# Patient Record
Sex: Male | Born: 1950 | Race: White | Hispanic: No | Marital: Married | Smoking: Current every day smoker
Health system: Southern US, Community
[De-identification: ages and names within clinical notes are randomized; demographics above are authoritative.]

## PROBLEM LIST (undated history)

## (undated) DIAGNOSIS — R05 Cough: Secondary | ICD-10-CM

## (undated) DIAGNOSIS — R2689 Other abnormalities of gait and mobility: Secondary | ICD-10-CM

## (undated) DIAGNOSIS — I10 Essential (primary) hypertension: Secondary | ICD-10-CM

## (undated) DIAGNOSIS — F1010 Alcohol abuse, uncomplicated: Secondary | ICD-10-CM

## (undated) DIAGNOSIS — R062 Wheezing: Secondary | ICD-10-CM

## (undated) DIAGNOSIS — R0989 Other specified symptoms and signs involving the circulatory and respiratory systems: Secondary | ICD-10-CM

## (undated) DIAGNOSIS — R059 Cough, unspecified: Secondary | ICD-10-CM

## (undated) HISTORY — PX: ELBOW SURGERY: SHX618

## (undated) HISTORY — PX: TONSILLECTOMY: SUR1361

---

## 1999-10-08 ENCOUNTER — Inpatient Hospital Stay (HOSPITAL_COMMUNITY): Admission: EM | Admit: 1999-10-08 | Discharge: 1999-10-11 | Payer: Self-pay | Admitting: *Deleted

## 2015-08-13 ENCOUNTER — Encounter: Payer: Self-pay | Admitting: *Deleted

## 2015-08-13 NOTE — Pre-Procedure Instructions (Signed)
GENERAL ANESTHESIA DUE TO ANXIETY

## 2015-08-20 ENCOUNTER — Encounter: Payer: Self-pay | Admitting: *Deleted

## 2015-08-20 ENCOUNTER — Ambulatory Visit
Admission: RE | Admit: 2015-08-20 | Discharge: 2015-08-20 | Disposition: A | Discharge status: Home / Self Care | Payer: BLUE CROSS/BLUE SHIELD | Source: Ambulatory Visit | Attending: Ophthalmology | Admitting: Ophthalmology

## 2015-08-20 ENCOUNTER — Encounter
Admission: RE | Disposition: A | Discharge status: Home / Self Care | Payer: Self-pay | Source: Ambulatory Visit | Attending: Ophthalmology

## 2015-08-20 ENCOUNTER — Ambulatory Visit: Payer: BLUE CROSS/BLUE SHIELD | Admitting: Registered Nurse

## 2015-08-20 DIAGNOSIS — E669 Obesity, unspecified: Secondary | ICD-10-CM | POA: Diagnosis not present

## 2015-08-20 DIAGNOSIS — R05 Cough: Secondary | ICD-10-CM | POA: Diagnosis not present

## 2015-08-20 DIAGNOSIS — J449 Chronic obstructive pulmonary disease, unspecified: Secondary | ICD-10-CM | POA: Insufficient documentation

## 2015-08-20 DIAGNOSIS — H2511 Age-related nuclear cataract, right eye: Secondary | ICD-10-CM | POA: Insufficient documentation

## 2015-08-20 DIAGNOSIS — I10 Essential (primary) hypertension: Secondary | ICD-10-CM | POA: Insufficient documentation

## 2015-08-20 DIAGNOSIS — F419 Anxiety disorder, unspecified: Secondary | ICD-10-CM | POA: Insufficient documentation

## 2015-08-20 DIAGNOSIS — E7800 Pure hypercholesterolemia, unspecified: Secondary | ICD-10-CM | POA: Insufficient documentation

## 2015-08-20 DIAGNOSIS — Z87891 Personal history of nicotine dependence: Secondary | ICD-10-CM | POA: Insufficient documentation

## 2015-08-20 DIAGNOSIS — R062 Wheezing: Secondary | ICD-10-CM | POA: Diagnosis not present

## 2015-08-20 HISTORY — DX: Cough: R05

## 2015-08-20 HISTORY — DX: Cough, unspecified: R05.9

## 2015-08-20 HISTORY — DX: Essential (primary) hypertension: I10

## 2015-08-20 HISTORY — DX: Wheezing: R06.2

## 2015-08-20 HISTORY — PX: CATARACT EXTRACTION W/PHACO: SHX586

## 2015-08-20 HISTORY — DX: Other specified symptoms and signs involving the circulatory and respiratory systems: R09.89

## 2015-08-20 SURGERY — PHACOEMULSIFICATION, CATARACT, WITH IOL INSERTION
Anesthesia: General | Site: Eye | Laterality: Right | Wound class: Clean

## 2015-08-20 MED ORDER — ONDANSETRON HCL 4 MG/2ML IJ SOLN
INTRAMUSCULAR | Status: DC | PRN
  Administered 2015-08-20: 4 mg via INTRAVENOUS

## 2015-08-20 MED ORDER — MIDAZOLAM HCL 2 MG/2ML IJ SOLN
INTRAMUSCULAR | Status: AC
  Administered 2015-08-20: 2 mg via INTRAVENOUS
  Filled 2015-08-20: qty 2

## 2015-08-20 MED ORDER — POVIDONE-IODINE 5 % OP SOLN
1.0000 "application " | Freq: Once | OPHTHALMIC | Status: AC
  Administered 2015-08-20: 1 via OPHTHALMIC

## 2015-08-20 MED ORDER — ARMC OPHTHALMIC DILATING GEL
OPHTHALMIC | Status: AC
  Administered 2015-08-20: 1 via OPHTHALMIC
  Filled 2015-08-20: qty 0.25

## 2015-08-20 MED ORDER — TETRACAINE HCL 0.5 % OP SOLN
1.0000 [drp] | Freq: Once | OPHTHALMIC | Status: AC
  Administered 2015-08-20: 1 [drp] via OPHTHALMIC

## 2015-08-20 MED ORDER — MIDAZOLAM HCL 2 MG/2ML IJ SOLN
INTRAMUSCULAR | Status: DC | PRN
  Administered 2015-08-20: 1 mg via INTRAVENOUS

## 2015-08-20 MED ORDER — SODIUM CHLORIDE 0.9 % IV SOLN
INTRAVENOUS | Status: DC
  Administered 2015-08-20: 08:00:00 via INTRAVENOUS

## 2015-08-20 MED ORDER — MOXIFLOXACIN HCL 0.5 % OP SOLN
OPHTHALMIC | Status: DC | PRN
  Administered 2015-08-20: 1 [drp] via OPHTHALMIC

## 2015-08-20 MED ORDER — ARMC OPHTHALMIC DILATING GEL
1.0000 | OPHTHALMIC | Status: DC | PRN
Start: 2015-08-20 — End: 2015-08-20
  Administered 2015-08-20: 1 via OPHTHALMIC

## 2015-08-20 MED ORDER — TRYPAN BLUE 0.06 % OP SOLN: OPHTHALMIC | Status: DC | PRN

## 2015-08-20 MED ORDER — LIDOCAINE HCL (PF) 4 % IJ SOLN
INTRAOCULAR | Status: DC | PRN
  Administered 2015-08-20: 4 mL via OPHTHALMIC

## 2015-08-20 MED ORDER — ONDANSETRON HCL 4 MG/2ML IJ SOLN: 4.0000 mg | Freq: Once | INTRAMUSCULAR | Status: DC | PRN

## 2015-08-20 MED ORDER — MIDAZOLAM HCL 2 MG/2ML IJ SOLN
2.0000 mg | Freq: Once | INTRAMUSCULAR | Status: AC
  Administered 2015-08-20: 2 mg via INTRAVENOUS

## 2015-08-20 MED ORDER — POVIDONE-IODINE 5 % OP SOLN
OPHTHALMIC | Status: AC
  Administered 2015-08-20: 1 via OPHTHALMIC
  Filled 2015-08-20: qty 30

## 2015-08-20 MED ORDER — TRYPAN BLUE 0.06 % OP SOLN
OPHTHALMIC | Status: AC
  Filled 2015-08-20: qty 0.5

## 2015-08-20 MED ORDER — MOXIFLOXACIN HCL 0.5 % OP SOLN: 1.0000 [drp] | OPHTHALMIC | Status: DC | PRN

## 2015-08-20 MED ORDER — NA HYALUR & NA CHOND-NA HYALUR 0.55-0.5 ML IO KIT
PACK | INTRAOCULAR | Status: AC
  Filled 2015-08-20: qty 1.05

## 2015-08-20 MED ORDER — CEFUROXIME OPHTHALMIC INJECTION 1 MG/0.1 ML
INJECTION | OPHTHALMIC | Status: DC | PRN
  Administered 2015-08-20: 1 mg via INTRACAMERAL

## 2015-08-20 MED ORDER — MOXIFLOXACIN HCL 0.5 % OP SOLN
OPHTHALMIC | Status: AC
  Filled 2015-08-20: qty 3

## 2015-08-20 MED ORDER — EPINEPHRINE HCL 1 MG/ML IJ SOLN
INTRAMUSCULAR | Status: AC
  Filled 2015-08-20: qty 1

## 2015-08-20 MED ORDER — FENTANYL CITRATE (PF) 100 MCG/2ML IJ SOLN
INTRAMUSCULAR | Status: DC | PRN
  Administered 2015-08-20: 50 ug via INTRAVENOUS

## 2015-08-20 MED ORDER — NA HYALUR & NA CHOND-NA HYALUR 0.55-0.5 ML IO KIT
PACK | INTRAOCULAR | Status: DC | PRN
  Administered 2015-08-20: 1 via OPHTHALMIC

## 2015-08-20 MED ORDER — FENTANYL CITRATE (PF) 100 MCG/2ML IJ SOLN: 25.0000 ug | INTRAMUSCULAR | Status: DC | PRN

## 2015-08-20 MED ORDER — EPINEPHRINE HCL 1 MG/ML IJ SOLN
INTRAOCULAR | Status: DC | PRN
  Administered 2015-08-20: 250 mL via OPHTHALMIC

## 2015-08-20 MED ORDER — TETRACAINE HCL 0.5 % OP SOLN
OPHTHALMIC | Status: AC
  Administered 2015-08-20: 1 [drp] via OPHTHALMIC
  Filled 2015-08-20: qty 2

## 2015-08-20 SURGICAL SUPPLY — 25 items
CANNULA ANT/CHMB 27G (MISCELLANEOUS) ×1 IMPLANT
CANNULA ANT/CHMB 27GA (MISCELLANEOUS) ×6 IMPLANT
CUP MEDICINE 2OZ PLAST GRAD ST (MISCELLANEOUS) ×3 IMPLANT
FILTER MILLEX .045 (MISCELLANEOUS) IMPLANT
FLTR MILLEX .045 (MISCELLANEOUS) ×3
GLOVE BIO SURGEON STRL SZ8 (GLOVE) ×3 IMPLANT
GLOVE BIOGEL M 6.5 STRL (GLOVE) ×3 IMPLANT
GLOVE SURG LX 7.5 STRW (GLOVE) ×2
GLOVE SURG LX STRL 7.5 STRW (GLOVE) ×1 IMPLANT
GOWN STRL REUS W/ TWL LRG LVL3 (GOWN DISPOSABLE) ×2 IMPLANT
GOWN STRL REUS W/TWL LRG LVL3 (GOWN DISPOSABLE) ×6
LENS IOL ACRSF IQ PC 23.0 (Intraocular Lens) IMPLANT
LENS IOL ACRYSOF IQ POST 23.0 (Intraocular Lens) ×3 IMPLANT
PACK CATARACT (MISCELLANEOUS) ×3 IMPLANT
PACK CATARACT BRASINGTON LX (MISCELLANEOUS) ×3 IMPLANT
PACK EYE AFTER SURG (MISCELLANEOUS) ×3 IMPLANT
SOL BSS BAG (MISCELLANEOUS) ×3
SOL PREP PVP 2OZ (MISCELLANEOUS) ×3
SOLUTION BSS BAG (MISCELLANEOUS) ×1 IMPLANT
SOLUTION PREP PVP 2OZ (MISCELLANEOUS) ×1 IMPLANT
SYR 3ML LL SCALE MARK (SYRINGE) ×3 IMPLANT
SYR 5ML LL (SYRINGE) ×3 IMPLANT
SYR TB 1ML 27GX1/2 LL (SYRINGE) ×3 IMPLANT
WATER STERILE IRR 1000ML POUR (IV SOLUTION) ×3 IMPLANT
WIPE NON LINTING 3.25X3.25 (MISCELLANEOUS) ×3 IMPLANT

## 2015-08-20 NOTE — Discharge Instructions (Signed)
Eye Surgery Discharge Instructions  Expect mild scratchy sensation or mild soreness. DO NOT RUB YOUR EYE!  The day of surgery:  Minimal physical activity, but bed rest is not required  No reading, computer work, or close hand work  No bending, lifting, or straining.  May watch TV  For 24 hours:  No driving, legal decisions, or alcoholic beverages  Safety precautions  Eat anything you prefer: It is better to start with liquids, then soup then solid foods.  _____ Eye patch should be worn until postoperative exam tomorrow.  ____ Solar shield eyeglasses should be worn for comfort in the sunlight/patch while sleeping  Resume all regular medications including aspirin or Coumadin if these were discontinued prior to surgery. You may shower, bathe, shave, or wash your hair. Tylenol may be taken for mild discomfort.  Call your doctor if you experience significant pain, nausea, or vomiting, fever > 101 or other signs of infection. [phone] or [phone] Specific instructions:  Follow-up Information    Follow up with Willey BladeBradley King, MD.   Specialty:  Ophthalmology   Why:  June 2 at 10:05   Contact information:   [address] [place] [id number] [phone]

## 2015-08-20 NOTE — H&P (Signed)
  The History and Physical notes are on paper, have been signed, and are to be scanned. The patient remains stable and unchanged from the H&P.   Previous H&P reviewed, patient examined, and there are no changes.  The patient ate some applesauce this morning.  A discussion was held with the patient and he elected to proceed with MAC instead of general.   Willey BladeBradley Vyctoria Dickman 08/20/2015 8:27 AM

## 2015-08-20 NOTE — Anesthesia Postprocedure Evaluation (Signed)
Anesthesia Post Note  [facility]  Procedure(s) Performed: Procedure(s) (LRB): CATARACT EXTRACTION PHACO AND INTRAOCULAR LENS PLACEMENT (IOC) (Right)  Patient location during evaluation: Other Anesthesia Type: MAC Level of consciousness: awake and alert Pain management: pain level controlled Vital Signs Assessment: post-procedure vital signs reviewed and stable Respiratory status: spontaneous breathing, nonlabored ventilation, respiratory function stable and patient connected to nasal cannula oxygen Cardiovascular status: stable and blood pressure returned to baseline Anesthetic complications: no    Last Vitals:  Filed Vitals:   08/20/15 0815 08/20/15 0945  BP: 170/118 129/85  Pulse: 73 68  Temp: 35.6 C 36.8 C  Resp: 16 12    Last Pain: There were no vitals filed for this visit.               Stormy Fabianurtis,  Dannon Nguyenthi A

## 2015-08-20 NOTE — Anesthesia Procedure Notes (Signed)
Procedure Name: MAC Date/Time: 08/20/2015 9:05 AM Performed by: Stormy FabianURTIS, Katriel Cutsforth Pre-anesthesia Checklist: Patient identified, Emergency Drugs available, Suction available and Patient being monitored Patient Re-evaluated:Patient Re-evaluated prior to inductionOxygen Delivery Method: Nasal cannula

## 2015-08-20 NOTE — Op Note (Signed)
OPERATIVE NOTE  Bryce JohannJulian Paul Thornton [id number] 08/20/2015   PREOPERATIVE DIAGNOSIS:  Nuclear sclerotic cataract right eye.  H25.11   POSTOPERATIVE DIAGNOSIS:    Nuclear sclerotic cataract right eye.     PROCEDURE:  Phacoemusification with posterior chamber intraocular lens placement of the right eye   LENS:   Implant Name Type Inv. Item Serial No. Manufacturer Lot No. LRB No. Used  IMPLANT LENS - [id number] Intraocular Lens IMPLANT LENS [id number] ALCON   Right 1       [id number] 23.0   ULTRASOUND TIME: 1 minutes 11 seconds.  CDE 13.07   SURGEON:  Willey BladeBradley [employer], MD, MPH  ANESTHESIOLOGIST: Anesthesiologist: [name], MD CRNA: Stormy FabianLinda Curtis, CRNA   ANESTHESIA:  Topical with tetracaine drops and 2% Xylocaine jelly, augmented with 1% preservative-free intracameral lidocaine. The patient was originally considered for general anesthesia, but ate applesauce this morning.  Then a discussion was held and the patient elected to proceed with MAC.  ESTIMATED BLOOD LOSS: less than 1 mL.   COMPLICATIONS:  None.  There was some positive posterior pressure and some challenge entering the 1.0 mm paracentesis, but no complications.   DESCRIPTION OF PROCEDURE:  The patient was identified in the holding room and transported to the operating room and placed in the supine position under the operating microscope.  The right eye was identified as the operative eye and it was prepped and draped in the usual sterile ophthalmic fashion.   A 1.0 millimeter clear-corneal paracentesis was made at the 10:30 position. 0.5 ml of preservative-free 1% lidocaine with epinephrine was injected into the anterior chamber.  The anterior chamber was filled with Viscoat viscoelastic.  A 2.4 millimeter keratome was used to make a near-clear corneal incision at the 8:00 position.  A curvilinear capsulorrhexis was made with a cystotome and capsulorrhexis forceps.  Balanced salt solution was used to  hydrodissect and hydrodelineate the nucleus.   Phacoemulsification was then used in stop and chop fashion to remove the lens nucleus and epinucleus.  The remaining cortex was then removed using the irrigation and aspiration handpiece. Provisc was then placed into the capsular bag to distend it for lens placement.  A lens was then injected into the capsular bag.  The remaining viscoelastic was aspirated.   Wounds were hydrated with balanced salt solution.  The anterior chamber was inflated to a physiologic pressure with balanced salt solution.  Cefuroxime 0.1 mL was injected into the anterior chamber. No wound leaks were noted.  Topical Vigamox drops were applied to the eye.  The patient was taken to the recovery room in stable condition without complications of anesthesia or surgery  Willey BladeBradley Deverick Pruss 08/20/2015, 9:44 AM

## 2015-08-20 NOTE — Transfer of Care (Signed)
Immediate Anesthesia Transfer of Care Note  [facility]  Procedure(s) Performed: Procedure(s) with comments: CATARACT EXTRACTION PHACO AND INTRAOCULAR LENS PLACEMENT (IOC) (Right) - Lot #[id number] H US: 01:11.0 AP%:18.4 CDE: 13.07  Patient Location: PHASE II  Anesthesia Type:MAC  Level of Consciousness: Awake, Alert, Oriented  Airway & Oxygen Therapy: Patient Spontanous Breathing and Patient on room air   Post-op Assessment: Report given to RN and Post -op Vital signs reviewed and stable  Post vital signs: Reviewed and stable  Last Vitals:  Filed Vitals:   08/20/15 0815 08/20/15 0945  BP: 170/118 129/85  Pulse: 73 68  Temp: 35.6 C 36.8 C  Resp: 16 12    Complications: No apparent anesthesia complications

## 2015-08-20 NOTE — Anesthesia Preprocedure Evaluation (Signed)
Anesthesia Evaluation  Patient identified by MRN, date of birth, ID band Patient awake    Reviewed: Allergy & Precautions, NPO status , Patient's Chart, lab work & pertinent test results  Airway Mallampati: III       Dental  (+) Poor Dentition, Loose, Missing, Dental Advisory Given   Pulmonary COPD, former smoker,    + rhonchi  + decreased breath sounds      Cardiovascular Exercise Tolerance: Good hypertension, Pt. on medications  Rhythm:Regular Rate:Normal     Neuro/Psych Anxiety    GI/Hepatic negative GI ROS, Neg liver ROS, (+)     substance abuse  alcohol use,   Endo/Other  negative endocrine ROS  Renal/GU      Musculoskeletal   Abdominal (+) + obese,   Peds  Hematology negative hematology ROS (+)   Anesthesia Other Findings   Reproductive/Obstetrics                             Anesthesia Physical Anesthesia Plan  ASA: III  Anesthesia Plan: General   Post-op Pain Management:    Induction: Intravenous  Airway Management Planned: LMA  Additional Equipment:   Intra-op Plan:   Post-operative Plan: Extubation in OR  Informed Consent: I have reviewed the patients History and Physical, chart, labs and discussed the procedure including the risks, benefits and alternatives for the proposed anesthesia with the patient or authorized representative who has indicated his/her understanding and acceptance.     Plan Discussed with: CRNA  Anesthesia Plan Comments:         Anesthesia Quick Evaluation

## 2017-06-26 DIAGNOSIS — R6520 Severe sepsis without septic shock: Secondary | ICD-10-CM

## 2017-06-26 DIAGNOSIS — M6282 Rhabdomyolysis: Secondary | ICD-10-CM

## 2017-06-26 DIAGNOSIS — J690 Pneumonitis due to inhalation of food and vomit: Secondary | ICD-10-CM | POA: Diagnosis not present

## 2017-06-26 DIAGNOSIS — F10231 Alcohol dependence with withdrawal delirium: Secondary | ICD-10-CM | POA: Diagnosis not present

## 2017-06-26 DIAGNOSIS — G9341 Metabolic encephalopathy: Secondary | ICD-10-CM | POA: Diagnosis not present

## 2017-06-26 DIAGNOSIS — N179 Acute kidney failure, unspecified: Secondary | ICD-10-CM | POA: Diagnosis not present

## 2017-06-27 DIAGNOSIS — R6520 Severe sepsis without septic shock: Secondary | ICD-10-CM | POA: Diagnosis not present

## 2017-06-27 DIAGNOSIS — N179 Acute kidney failure, unspecified: Secondary | ICD-10-CM | POA: Diagnosis not present

## 2017-06-27 DIAGNOSIS — G9341 Metabolic encephalopathy: Secondary | ICD-10-CM | POA: Diagnosis not present

## 2017-06-27 DIAGNOSIS — M6282 Rhabdomyolysis: Secondary | ICD-10-CM | POA: Diagnosis not present

## 2017-06-27 DIAGNOSIS — J690 Pneumonitis due to inhalation of food and vomit: Secondary | ICD-10-CM | POA: Diagnosis not present

## 2017-06-27 DIAGNOSIS — F10231 Alcohol dependence with withdrawal delirium: Secondary | ICD-10-CM | POA: Diagnosis not present

## 2017-06-28 DIAGNOSIS — M6282 Rhabdomyolysis: Secondary | ICD-10-CM | POA: Diagnosis not present

## 2017-06-28 DIAGNOSIS — N179 Acute kidney failure, unspecified: Secondary | ICD-10-CM | POA: Diagnosis not present

## 2017-06-28 DIAGNOSIS — R6520 Severe sepsis without septic shock: Secondary | ICD-10-CM | POA: Diagnosis not present

## 2017-06-28 DIAGNOSIS — J690 Pneumonitis due to inhalation of food and vomit: Secondary | ICD-10-CM | POA: Diagnosis not present

## 2017-06-28 DIAGNOSIS — F10231 Alcohol dependence with withdrawal delirium: Secondary | ICD-10-CM | POA: Diagnosis not present

## 2017-06-28 DIAGNOSIS — G9341 Metabolic encephalopathy: Secondary | ICD-10-CM | POA: Diagnosis not present

## 2017-06-29 DIAGNOSIS — G9341 Metabolic encephalopathy: Secondary | ICD-10-CM | POA: Diagnosis not present

## 2017-06-29 DIAGNOSIS — J690 Pneumonitis due to inhalation of food and vomit: Secondary | ICD-10-CM | POA: Diagnosis not present

## 2017-06-29 DIAGNOSIS — R6520 Severe sepsis without septic shock: Secondary | ICD-10-CM | POA: Diagnosis not present

## 2017-06-29 DIAGNOSIS — M6282 Rhabdomyolysis: Secondary | ICD-10-CM | POA: Diagnosis not present

## 2017-06-30 DIAGNOSIS — M6282 Rhabdomyolysis: Secondary | ICD-10-CM | POA: Diagnosis not present

## 2017-06-30 DIAGNOSIS — J690 Pneumonitis due to inhalation of food and vomit: Secondary | ICD-10-CM | POA: Diagnosis not present

## 2017-06-30 DIAGNOSIS — G9341 Metabolic encephalopathy: Secondary | ICD-10-CM | POA: Diagnosis not present

## 2017-06-30 DIAGNOSIS — R6520 Severe sepsis without septic shock: Secondary | ICD-10-CM | POA: Diagnosis not present

## 2018-01-11 ENCOUNTER — Encounter: Payer: Self-pay | Admitting: *Deleted

## 2018-01-17 ENCOUNTER — Encounter: Payer: Self-pay | Admitting: *Deleted

## 2018-01-17 ENCOUNTER — Ambulatory Visit
Admission: RE | Admit: 2018-01-17 | Discharge: 2018-01-17 | Disposition: A | Discharge status: Home / Self Care | Payer: Medicare Other | Source: Ambulatory Visit | Attending: Ophthalmology | Admitting: Ophthalmology

## 2018-01-17 ENCOUNTER — Ambulatory Visit: Payer: Medicare Other | Admitting: Anesthesiology

## 2018-01-17 ENCOUNTER — Encounter
Admission: RE | Disposition: A | Discharge status: Home / Self Care | Payer: Self-pay | Source: Ambulatory Visit | Attending: Ophthalmology

## 2018-01-17 ENCOUNTER — Other Ambulatory Visit: Payer: Self-pay

## 2018-01-17 DIAGNOSIS — Z79899 Other long term (current) drug therapy: Secondary | ICD-10-CM | POA: Insufficient documentation

## 2018-01-17 DIAGNOSIS — E7800 Pure hypercholesterolemia, unspecified: Secondary | ICD-10-CM | POA: Insufficient documentation

## 2018-01-17 DIAGNOSIS — I10 Essential (primary) hypertension: Secondary | ICD-10-CM | POA: Insufficient documentation

## 2018-01-17 DIAGNOSIS — H2512 Age-related nuclear cataract, left eye: Secondary | ICD-10-CM | POA: Diagnosis not present

## 2018-01-17 DIAGNOSIS — F17200 Nicotine dependence, unspecified, uncomplicated: Secondary | ICD-10-CM | POA: Diagnosis not present

## 2018-01-17 HISTORY — DX: Other abnormalities of gait and mobility: R26.89

## 2018-01-17 HISTORY — PX: CATARACT EXTRACTION W/PHACO: SHX586

## 2018-01-17 SURGERY — PHACOEMULSIFICATION, CATARACT, WITH IOL INSERTION
Anesthesia: Monitor Anesthesia Care | Site: Eye | Laterality: Left

## 2018-01-17 MED ORDER — SODIUM HYALURONATE 23 MG/ML IO SOLN
INTRAOCULAR | Status: AC
  Filled 2018-01-17: qty 0.6

## 2018-01-17 MED ORDER — POVIDONE-IODINE 5 % OP SOLN
OPHTHALMIC | Status: AC
  Filled 2018-01-17: qty 30

## 2018-01-17 MED ORDER — SODIUM CHLORIDE 0.9 % IV SOLN
INTRAVENOUS | Status: DC
  Administered 2018-01-17: 06:00:00 via INTRAVENOUS

## 2018-01-17 MED ORDER — SODIUM HYALURONATE 23 MG/ML IO SOLN
INTRAOCULAR | Status: DC | PRN
  Administered 2018-01-17: 0.6 mL via INTRAOCULAR

## 2018-01-17 MED ORDER — TETRACAINE HCL 0.5 % OP SOLN
OPHTHALMIC | Status: AC
  Administered 2018-01-17: 1 [drp] via OPHTHALMIC
  Filled 2018-01-17: qty 4

## 2018-01-17 MED ORDER — TRYPAN BLUE 0.06 % OP SOLN
OPHTHALMIC | Status: AC
  Filled 2018-01-17: qty 0.5

## 2018-01-17 MED ORDER — ARMC OPHTHALMIC DILATING DROPS
OPHTHALMIC | Status: AC
  Administered 2018-01-17: 1 via OPHTHALMIC
  Filled 2018-01-17: qty 0.5

## 2018-01-17 MED ORDER — MOXIFLOXACIN HCL 0.5 % OP SOLN
OPHTHALMIC | Status: AC
  Filled 2018-01-17: qty 3

## 2018-01-17 MED ORDER — MOXIFLOXACIN HCL 0.5 % OP SOLN
OPHTHALMIC | Status: DC | PRN
  Administered 2018-01-17: 0.2 mL via OPHTHALMIC

## 2018-01-17 MED ORDER — LIDOCAINE HCL (PF) 4 % IJ SOLN
INTRAMUSCULAR | Status: AC
  Filled 2018-01-17: qty 5

## 2018-01-17 MED ORDER — TETRACAINE HCL 0.5 % OP SOLN
1.0000 [drp] | OPHTHALMIC | Status: AC | PRN
  Administered 2018-01-17 (×3): 1 [drp] via OPHTHALMIC

## 2018-01-17 MED ORDER — MIDAZOLAM HCL 2 MG/2ML IJ SOLN
INTRAMUSCULAR | Status: AC
  Filled 2018-01-17: qty 2

## 2018-01-17 MED ORDER — CARBACHOL 0.01 % IO SOLN
INTRAOCULAR | Status: DC | PRN
  Administered 2018-01-17: 0.5 mL via INTRAOCULAR

## 2018-01-17 MED ORDER — POVIDONE-IODINE 5 % OP SOLN
OPHTHALMIC | Status: DC | PRN
  Administered 2018-01-17: 1 via OPHTHALMIC

## 2018-01-17 MED ORDER — EPINEPHRINE PF 1 MG/ML IJ SOLN
INTRAOCULAR | Status: DC | PRN
  Administered 2018-01-17: 08:00:00 via OPHTHALMIC

## 2018-01-17 MED ORDER — MOXIFLOXACIN HCL 0.5 % OP SOLN: 1.0000 [drp] | OPHTHALMIC | Status: DC | PRN

## 2018-01-17 MED ORDER — FENTANYL CITRATE (PF) 100 MCG/2ML IJ SOLN
INTRAMUSCULAR | Status: AC
  Filled 2018-01-17: qty 2

## 2018-01-17 MED ORDER — LIDOCAINE HCL (PF) 4 % IJ SOLN
INTRAOCULAR | Status: DC | PRN
  Administered 2018-01-17: 4 mL via OPHTHALMIC

## 2018-01-17 MED ORDER — MIDAZOLAM HCL 2 MG/2ML IJ SOLN
INTRAMUSCULAR | Status: DC | PRN
  Administered 2018-01-17: 2 mg via INTRAVENOUS

## 2018-01-17 MED ORDER — EPINEPHRINE PF 1 MG/ML IJ SOLN
INTRAMUSCULAR | Status: AC
  Filled 2018-01-17: qty 2

## 2018-01-17 MED ORDER — FENTANYL CITRATE (PF) 100 MCG/2ML IJ SOLN
INTRAMUSCULAR | Status: DC | PRN
  Administered 2018-01-17 (×2): 50 ug via INTRAVENOUS

## 2018-01-17 MED ORDER — ARMC OPHTHALMIC DILATING DROPS
1.0000 "application " | OPHTHALMIC | Status: AC
  Administered 2018-01-17 (×3): 1 via OPHTHALMIC

## 2018-01-17 MED ORDER — SODIUM HYALURONATE 10 MG/ML IO SOLN
INTRAOCULAR | Status: DC | PRN
  Administered 2018-01-17: 0.55 mL via INTRAOCULAR

## 2018-01-17 SURGICAL SUPPLY — 19 items
CANNULA ANT/CHMB 27G (MISCELLANEOUS) IMPLANT
CANNULA ANT/CHMB 27GA (MISCELLANEOUS) ×6 IMPLANT
DISSECTOR HYDRO NUCLEUS 50X22 (MISCELLANEOUS) ×3 IMPLANT
GLOVE BIO SURGEON STRL SZ8 (GLOVE) ×3 IMPLANT
GLOVE BIOGEL M 6.5 STRL (GLOVE) ×3 IMPLANT
GLOVE SURG LX 7.5 STRW (GLOVE) ×2
GLOVE SURG LX STRL 7.5 STRW (GLOVE) ×1 IMPLANT
GOWN STRL REUS W/ TWL LRG LVL3 (GOWN DISPOSABLE) ×2 IMPLANT
GOWN STRL REUS W/TWL LRG LVL3 (GOWN DISPOSABLE) ×6
LABEL CATARACT MEDS ST (LABEL) ×3 IMPLANT
LENS IOL ACRSF IQ ULTRA 23.0 (Intraocular Lens) IMPLANT
LENS IOL ACRYSOF IQ 23.0 (Intraocular Lens) ×3 IMPLANT
PACK CATARACT (MISCELLANEOUS) ×3 IMPLANT
PACK CATARACT KING (MISCELLANEOUS) ×3 IMPLANT
PACK EYE AFTER SURG (MISCELLANEOUS) ×3 IMPLANT
SOL BSS BAG (MISCELLANEOUS) ×3
SOLUTION BSS BAG (MISCELLANEOUS) ×1 IMPLANT
WATER STERILE IRR 250ML POUR (IV SOLUTION) ×3 IMPLANT
WIPE NON LINTING 3.25X3.25 (MISCELLANEOUS) ×3 IMPLANT

## 2018-01-17 NOTE — Anesthesia Postprocedure Evaluation (Signed)
Anesthesia Post Note  Patient: Bryce Thornton  Procedure(s) Performed: CATARACT EXTRACTION PHACO AND INTRAOCULAR LENS PLACEMENT (IOC) (Left Eye)  Patient location during evaluation: PACU Anesthesia Type: MAC Level of consciousness: awake and alert and oriented Pain management: pain level controlled Vital Signs Assessment: post-procedure vital signs reviewed and stable Respiratory status: spontaneous breathing, nonlabored ventilation and respiratory function stable Cardiovascular status: blood pressure returned to baseline and stable Postop Assessment: no signs of nausea or vomiting Anesthetic complications: no     Last Vitals:  Vitals:   01/17/18 0620 01/17/18 0810  BP: (!) 173/107 (!) 151/90  Pulse: 95 76  Resp: 18 16  Temp: (!) 36.3 C (!) 36.2 C  SpO2: 98% 98%    Last Pain:  Vitals:   01/17/18 0810  TempSrc: Temporal  PainSc: 0-No pain                 Jette Lewan

## 2018-01-17 NOTE — Transfer of Care (Signed)
Immediate Anesthesia Transfer of Care Note  Patient: Bryce Thornton  Procedure(s) Performed: CATARACT EXTRACTION PHACO AND INTRAOCULAR LENS PLACEMENT (IOC) (Left Eye)  Patient Location: PACU  Anesthesia Type:MAC  Level of Consciousness: awake, alert  and oriented  Airway & Oxygen Therapy: Patient Spontanous Breathing  Post-op Assessment: Report given to RN and Post -op Vital signs reviewed and stable  Post vital signs: Reviewed and stable  Last Vitals:  Vitals Value Taken Time  BP    Temp    Pulse    Resp    SpO2      Last Pain:  Vitals:   01/17/18 0620  TempSrc: Tympanic  PainSc: 0-No pain         Complications: No apparent anesthesia complications

## 2018-01-17 NOTE — Discharge Instructions (Signed)
Eye Surgery Discharge Instructions ° ° ° °Expect mild scratchy sensation or mild soreness. °DO NOT RUB YOUR EYE! ° °The day of surgery: °Minimal physical activity, but bed rest is not required °No reading, computer work, or close hand work °No bending, lifting, or straining. °May watch TV ° °For 24 hours: °No driving, legal decisions, or alcoholic beverages °Safety precautions °Eat anything you prefer: It is better to start with liquids, then soup then solid foods. °_____ Eye patch should be worn until postoperative exam tomorrow. °____ Solar shield eyeglasses should be worn for comfort in the sunlight/patch while sleeping ° °Resume all regular medications including aspirin or Coumadin if these were discontinued prior to surgery. °You may shower, bathe, shave, or wash your hair. °Tylenol may be taken for mild discomfort. ° °Call your doctor if you experience significant pain, nausea, or vomiting, fever > 101 or other signs of infection. [phone] or [phone] °Specific instructions: ° ° Follow-up Information   ° ° King, Bradley Mark, MD Follow up.   °Specialty: Ophthalmology °Contact information: °[address] °[place]  [phone] ° ° °  °  ° °  °  ° °  °  °

## 2018-01-17 NOTE — Op Note (Signed)
OPERATIVE NOTE  Bryce Thornton [id number] 01/17/2018   PREOPERATIVE DIAGNOSIS:  Nuclear sclerotic cataract left eye.  H25.12   POSTOPERATIVE DIAGNOSIS:    Nuclear sclerotic cataract left eye.     PROCEDURE:  Phacoemusification with posterior chamber intraocular lens placement of the left eye   LENS:   Implant Name Type Inv. Item Serial No. Manufacturer Lot No. LRB No. Used  LENS IOL ACRYSOF IQ 23.0 - [id number] Intraocular Lens LENS IOL ACRYSOF IQ 23.0 [id number] ALCON  Left 1       [id number] +23.0   ULTRASOUND TIME: 1 minutes 22 seconds.  CDE 12.21   SURGEON:  Willey Blade, MD, MPH   ANESTHESIA:  Topical with tetracaine drops augmented with 1% preservative-free intracameral lidocaine.  ESTIMATED BLOOD LOSS: <1 mL   COMPLICATIONS:  None.   DESCRIPTION OF PROCEDURE:  The patient was identified in the holding room and transported to the operating room and placed in the supine position under the operating microscope.  The left eye was identified as the operative eye and it was prepped and draped in the usual sterile ophthalmic fashion.   A 1.0 millimeter clear-corneal paracentesis was made at the 5:00 position. 0.5 ml of preservative-free 1% lidocaine with epinephrine was injected into the anterior chamber.  The anterior chamber was filled with Healon 5 viscoelastic.  A 2.4 millimeter keratome was used to make a near-clear corneal incision at the 2:00 position.  A curvilinear capsulorrhexis was made with a cystotome and capsulorrhexis forceps.  Balanced salt solution was used to hydrodissect and hydrodelineate the nucleus.   Phacoemulsification was then used in stop and chop fashion to remove the lens nucleus and epinucleus.  The remaining cortex was then removed using the irrigation and aspiration handpiece. Healon was then placed into the capsular bag to distend it for lens placement.  A lens was then injected into the capsular bag.  The remaining viscoelastic was aspirated.    Wounds were hydrated with balanced salt solution.  The anterior chamber was inflated to a physiologic pressure with balanced salt solution.  Intracameral vigamox 0.1 mL undiltued was injected into the eye and a drop placed onto the ocular surface.  No wound leaks were noted.  The patient was taken to the recovery room in stable condition without complications of anesthesia or surgery  Willey Blade 01/17/2018, 8:07 AM

## 2018-01-17 NOTE — Anesthesia Post-op Follow-up Note (Signed)
Anesthesia QCDR form completed.        

## 2018-01-17 NOTE — Anesthesia Preprocedure Evaluation (Signed)
Anesthesia Evaluation  Patient identified by MRN, date of birth, ID band Patient awake    Reviewed: Allergy & Precautions, NPO status , Patient's Chart, lab work & pertinent test results  History of Anesthesia Complications Negative for: history of anesthetic complications  Airway Mallampati: III  TM Distance: >3 FB Neck ROM: Full    Dental  (+) Poor Dentition, Missing   Pulmonary neg sleep apnea, neg COPD, Current Smoker,    breath sounds clear to auscultation- rhonchi (-) wheezing      Cardiovascular hypertension, (-) CAD, (-) Past MI, (-) Cardiac Stents and (-) CABG  Rhythm:Regular Rate:Normal - Systolic murmurs and - Diastolic murmurs    Neuro/Psych negative neurological ROS  negative psych ROS   GI/Hepatic negative GI ROS, Neg liver ROS,   Endo/Other  negative endocrine ROSneg diabetes  Renal/GU negative Renal ROS     Musculoskeletal negative musculoskeletal ROS (+)   Abdominal (+) - obese,   Peds  Hematology negative hematology ROS (+)   Anesthesia Other Findings Past Medical History: No date: Balance problem No date: Cough     Comment:  CHRONIC No date: Hypertension No date: Sinus complaint No date: Wheezing   Reproductive/Obstetrics                             Anesthesia Physical Anesthesia Plan  ASA: II  Anesthesia Plan: MAC   Post-op Pain Management:    Induction: Intravenous  PONV Risk Score and Plan: 1 and Midazolam  Airway Management Planned: Natural Airway  Additional Equipment:   Intra-op Plan:   Post-operative Plan:   Informed Consent: I have reviewed the patients History and Physical, chart, labs and discussed the procedure including the risks, benefits and alternatives for the proposed anesthesia with the patient or authorized representative who has indicated his/her understanding and acceptance.     Plan Discussed with: CRNA and  Anesthesiologist  Anesthesia Plan Comments:         Anesthesia Quick Evaluation

## 2018-01-17 NOTE — H&P (Signed)
The History and Physical notes are on paper, have been signed, and are to be scanned.   I have examined the patient and there are no changes to the H&P.   Willey Blade 01/17/2018 7:16 AM

## 2018-11-16 DIAGNOSIS — E512 Wernicke's encephalopathy: Secondary | ICD-10-CM | POA: Diagnosis not present

## 2018-11-16 DIAGNOSIS — I10 Essential (primary) hypertension: Secondary | ICD-10-CM

## 2018-11-17 DIAGNOSIS — E512 Wernicke's encephalopathy: Secondary | ICD-10-CM | POA: Diagnosis not present

## 2018-11-17 DIAGNOSIS — I10 Essential (primary) hypertension: Secondary | ICD-10-CM | POA: Diagnosis not present

## 2018-11-18 DIAGNOSIS — I10 Essential (primary) hypertension: Secondary | ICD-10-CM | POA: Diagnosis not present

## 2018-11-18 DIAGNOSIS — E512 Wernicke's encephalopathy: Secondary | ICD-10-CM | POA: Diagnosis not present

## 2018-11-19 DIAGNOSIS — E512 Wernicke's encephalopathy: Secondary | ICD-10-CM | POA: Diagnosis not present

## 2018-11-19 DIAGNOSIS — I10 Essential (primary) hypertension: Secondary | ICD-10-CM | POA: Diagnosis not present

## 2018-11-20 DIAGNOSIS — I10 Essential (primary) hypertension: Secondary | ICD-10-CM | POA: Diagnosis not present

## 2018-11-20 DIAGNOSIS — E512 Wernicke's encephalopathy: Secondary | ICD-10-CM | POA: Diagnosis not present

## 2018-11-21 DIAGNOSIS — I10 Essential (primary) hypertension: Secondary | ICD-10-CM | POA: Diagnosis not present

## 2018-11-21 DIAGNOSIS — E512 Wernicke's encephalopathy: Secondary | ICD-10-CM | POA: Diagnosis not present

## 2018-11-22 DIAGNOSIS — E512 Wernicke's encephalopathy: Secondary | ICD-10-CM | POA: Diagnosis not present

## 2018-11-22 DIAGNOSIS — I10 Essential (primary) hypertension: Secondary | ICD-10-CM | POA: Diagnosis not present

## 2019-01-20 ENCOUNTER — Emergency Department (HOSPITAL_COMMUNITY): Payer: Medicare Other

## 2019-01-20 ENCOUNTER — Inpatient Hospital Stay (HOSPITAL_COMMUNITY)
Admission: EM | Admit: 2019-01-20 | Discharge: 2019-01-25 | DRG: 641 | Disposition: A | Discharge status: Skilled Nursing Facility | Payer: Medicare Other | Attending: Family Medicine | Admitting: Family Medicine

## 2019-01-20 ENCOUNTER — Encounter (HOSPITAL_COMMUNITY): Payer: Self-pay

## 2019-01-20 ENCOUNTER — Other Ambulatory Visit: Payer: Self-pay

## 2019-01-20 DIAGNOSIS — K089 Disorder of teeth and supporting structures, unspecified: Secondary | ICD-10-CM | POA: Diagnosis present

## 2019-01-20 DIAGNOSIS — S80812A Abrasion, left lower leg, initial encounter: Secondary | ICD-10-CM | POA: Diagnosis present

## 2019-01-20 DIAGNOSIS — K709 Alcoholic liver disease, unspecified: Secondary | ICD-10-CM | POA: Diagnosis present

## 2019-01-20 DIAGNOSIS — Z20828 Contact with and (suspected) exposure to other viral communicable diseases: Secondary | ICD-10-CM | POA: Diagnosis present

## 2019-01-20 DIAGNOSIS — R7401 Elevation of levels of liver transaminase levels: Secondary | ICD-10-CM | POA: Diagnosis present

## 2019-01-20 DIAGNOSIS — E8809 Other disorders of plasma-protein metabolism, not elsewhere classified: Secondary | ICD-10-CM | POA: Diagnosis present

## 2019-01-20 DIAGNOSIS — R791 Abnormal coagulation profile: Secondary | ICD-10-CM | POA: Diagnosis not present

## 2019-01-20 DIAGNOSIS — E876 Hypokalemia: Secondary | ICD-10-CM | POA: Diagnosis present

## 2019-01-20 DIAGNOSIS — S0083XA Contusion of other part of head, initial encounter: Secondary | ICD-10-CM | POA: Diagnosis present

## 2019-01-20 DIAGNOSIS — I959 Hypotension, unspecified: Secondary | ICD-10-CM | POA: Diagnosis present

## 2019-01-20 DIAGNOSIS — E440 Moderate protein-calorie malnutrition: Secondary | ICD-10-CM | POA: Diagnosis present

## 2019-01-20 DIAGNOSIS — J324 Chronic pansinusitis: Secondary | ICD-10-CM | POA: Diagnosis present

## 2019-01-20 DIAGNOSIS — H10503 Unspecified blepharoconjunctivitis, bilateral: Secondary | ICD-10-CM | POA: Diagnosis not present

## 2019-01-20 DIAGNOSIS — L89220 Pressure ulcer of left hip, unstageable: Secondary | ICD-10-CM | POA: Diagnosis present

## 2019-01-20 DIAGNOSIS — E878 Other disorders of electrolyte and fluid balance, not elsewhere classified: Secondary | ICD-10-CM | POA: Diagnosis present

## 2019-01-20 DIAGNOSIS — H109 Unspecified conjunctivitis: Secondary | ICD-10-CM | POA: Diagnosis present

## 2019-01-20 DIAGNOSIS — L03116 Cellulitis of left lower limb: Secondary | ICD-10-CM | POA: Diagnosis present

## 2019-01-20 DIAGNOSIS — L8990 Pressure ulcer of unspecified site, unspecified stage: Secondary | ICD-10-CM | POA: Insufficient documentation

## 2019-01-20 DIAGNOSIS — L989 Disorder of the skin and subcutaneous tissue, unspecified: Secondary | ICD-10-CM | POA: Diagnosis not present

## 2019-01-20 DIAGNOSIS — I96 Gangrene, not elsewhere classified: Secondary | ICD-10-CM | POA: Diagnosis not present

## 2019-01-20 DIAGNOSIS — F1020 Alcohol dependence, uncomplicated: Secondary | ICD-10-CM

## 2019-01-20 DIAGNOSIS — B9689 Other specified bacterial agents as the cause of diseases classified elsewhere: Secondary | ICD-10-CM | POA: Diagnosis not present

## 2019-01-20 DIAGNOSIS — S40022A Contusion of left upper arm, initial encounter: Secondary | ICD-10-CM | POA: Diagnosis present

## 2019-01-20 DIAGNOSIS — K0889 Other specified disorders of teeth and supporting structures: Secondary | ICD-10-CM | POA: Diagnosis not present

## 2019-01-20 DIAGNOSIS — L89216 Pressure-induced deep tissue damage of right hip: Secondary | ICD-10-CM | POA: Diagnosis present

## 2019-01-20 DIAGNOSIS — F10231 Alcohol dependence with withdrawal delirium: Secondary | ICD-10-CM | POA: Diagnosis present

## 2019-01-20 DIAGNOSIS — L03115 Cellulitis of right lower limb: Secondary | ICD-10-CM | POA: Diagnosis present

## 2019-01-20 DIAGNOSIS — F10931 Alcohol use, unspecified with withdrawal delirium: Secondary | ICD-10-CM | POA: Diagnosis present

## 2019-01-20 DIAGNOSIS — E872 Acidosis: Secondary | ICD-10-CM | POA: Diagnosis present

## 2019-01-20 DIAGNOSIS — E871 Hypo-osmolality and hyponatremia: Secondary | ICD-10-CM | POA: Diagnosis present

## 2019-01-20 DIAGNOSIS — B351 Tinea unguium: Secondary | ICD-10-CM | POA: Diagnosis present

## 2019-01-20 DIAGNOSIS — W19XXXA Unspecified fall, initial encounter: Secondary | ICD-10-CM | POA: Diagnosis present

## 2019-01-20 DIAGNOSIS — F1010 Alcohol abuse, uncomplicated: Secondary | ICD-10-CM | POA: Diagnosis not present

## 2019-01-20 DIAGNOSIS — Z720 Tobacco use: Secondary | ICD-10-CM | POA: Diagnosis not present

## 2019-01-20 DIAGNOSIS — E860 Dehydration: Secondary | ICD-10-CM | POA: Diagnosis present

## 2019-01-20 DIAGNOSIS — R4189 Other symptoms and signs involving cognitive functions and awareness: Secondary | ICD-10-CM | POA: Diagnosis present

## 2019-01-20 DIAGNOSIS — R748 Abnormal levels of other serum enzymes: Secondary | ICD-10-CM | POA: Diagnosis not present

## 2019-01-20 DIAGNOSIS — R7881 Bacteremia: Secondary | ICD-10-CM | POA: Diagnosis present

## 2019-01-20 DIAGNOSIS — J439 Emphysema, unspecified: Secondary | ICD-10-CM | POA: Diagnosis present

## 2019-01-20 DIAGNOSIS — R419 Unspecified symptoms and signs involving cognitive functions and awareness: Secondary | ICD-10-CM

## 2019-01-20 DIAGNOSIS — L89626 Pressure-induced deep tissue damage of left heel: Secondary | ICD-10-CM | POA: Diagnosis present

## 2019-01-20 DIAGNOSIS — R5381 Other malaise: Secondary | ICD-10-CM | POA: Diagnosis present

## 2019-01-20 DIAGNOSIS — E512 Wernicke's encephalopathy: Principal | ICD-10-CM | POA: Diagnosis present

## 2019-01-20 DIAGNOSIS — R2689 Other abnormalities of gait and mobility: Secondary | ICD-10-CM | POA: Diagnosis present

## 2019-01-20 DIAGNOSIS — R4182 Altered mental status, unspecified: Secondary | ICD-10-CM | POA: Diagnosis present

## 2019-01-20 DIAGNOSIS — G319 Degenerative disease of nervous system, unspecified: Secondary | ICD-10-CM

## 2019-01-20 DIAGNOSIS — L89020 Pressure ulcer of left elbow, unstageable: Secondary | ICD-10-CM | POA: Diagnosis present

## 2019-01-20 DIAGNOSIS — A4159 Other Gram-negative sepsis: Secondary | ICD-10-CM | POA: Diagnosis present

## 2019-01-20 DIAGNOSIS — Y92019 Unspecified place in single-family (private) house as the place of occurrence of the external cause: Secondary | ICD-10-CM

## 2019-01-20 DIAGNOSIS — L98499 Non-pressure chronic ulcer of skin of other sites with unspecified severity: Secondary | ICD-10-CM | POA: Diagnosis not present

## 2019-01-20 DIAGNOSIS — M6282 Rhabdomyolysis: Secondary | ICD-10-CM | POA: Diagnosis present

## 2019-01-20 DIAGNOSIS — Z79899 Other long term (current) drug therapy: Secondary | ICD-10-CM

## 2019-01-20 DIAGNOSIS — R7989 Other specified abnormal findings of blood chemistry: Secondary | ICD-10-CM | POA: Diagnosis present

## 2019-01-20 DIAGNOSIS — R402412 Glasgow coma scale score 13-15, at arrival to emergency department: Secondary | ICD-10-CM | POA: Diagnosis present

## 2019-01-20 DIAGNOSIS — R531 Weakness: Secondary | ICD-10-CM

## 2019-01-20 DIAGNOSIS — Z9181 History of falling: Secondary | ICD-10-CM

## 2019-01-20 DIAGNOSIS — Z833 Family history of diabetes mellitus: Secondary | ICD-10-CM

## 2019-01-20 DIAGNOSIS — Z6826 Body mass index (BMI) 26.0-26.9, adult: Secondary | ICD-10-CM

## 2019-01-20 DIAGNOSIS — I10 Essential (primary) hypertension: Secondary | ICD-10-CM | POA: Diagnosis present

## 2019-01-20 DIAGNOSIS — S80811A Abrasion, right lower leg, initial encounter: Secondary | ICD-10-CM | POA: Diagnosis present

## 2019-01-20 DIAGNOSIS — F17200 Nicotine dependence, unspecified, uncomplicated: Secondary | ICD-10-CM | POA: Diagnosis present

## 2019-01-20 DIAGNOSIS — S40021A Contusion of right upper arm, initial encounter: Secondary | ICD-10-CM | POA: Diagnosis present

## 2019-01-20 DIAGNOSIS — R946 Abnormal results of thyroid function studies: Secondary | ICD-10-CM | POA: Diagnosis present

## 2019-01-20 DIAGNOSIS — T07XXXA Unspecified multiple injuries, initial encounter: Secondary | ICD-10-CM | POA: Diagnosis present

## 2019-01-20 DIAGNOSIS — R824 Acetonuria: Secondary | ICD-10-CM | POA: Diagnosis present

## 2019-01-20 DIAGNOSIS — I444 Left anterior fascicular block: Secondary | ICD-10-CM | POA: Diagnosis present

## 2019-01-20 DIAGNOSIS — R400 Somnolence: Secondary | ICD-10-CM | POA: Diagnosis not present

## 2019-01-20 DIAGNOSIS — W19XXXD Unspecified fall, subsequent encounter: Secondary | ICD-10-CM | POA: Diagnosis not present

## 2019-01-20 DIAGNOSIS — R680 Hypothermia, not associated with low environmental temperature: Secondary | ICD-10-CM | POA: Diagnosis present

## 2019-01-20 DIAGNOSIS — R410 Disorientation, unspecified: Secondary | ICD-10-CM | POA: Diagnosis not present

## 2019-01-20 DIAGNOSIS — S8012XA Contusion of left lower leg, initial encounter: Secondary | ICD-10-CM | POA: Diagnosis present

## 2019-01-20 DIAGNOSIS — S8011XA Contusion of right lower leg, initial encounter: Secondary | ICD-10-CM | POA: Diagnosis present

## 2019-01-20 HISTORY — DX: Alcohol abuse, uncomplicated: F10.10

## 2019-01-20 LAB — RAPID URINE DRUG SCREEN, HOSP PERFORMED
Amphetamines: NOT DETECTED
Barbiturates: NOT DETECTED
Benzodiazepines: NOT DETECTED
Cocaine: NOT DETECTED
Opiates: NOT DETECTED
Tetrahydrocannabinol: NOT DETECTED

## 2019-01-20 LAB — COMPREHENSIVE METABOLIC PANEL
ALT: 32 U/L (ref 0–44)
AST: 75 U/L — ABNORMAL HIGH (ref 15–41)
Albumin: 2.8 g/dL — ABNORMAL LOW (ref 3.5–5.0)
Alkaline Phosphatase: 115 U/L (ref 38–126)
Anion gap: 20 — ABNORMAL HIGH (ref 5–15)
BUN: 14 mg/dL (ref 8–23)
CO2: 17 mmol/L — ABNORMAL LOW (ref 22–32)
Calcium: 9.1 mg/dL (ref 8.9–10.3)
Chloride: 92 mmol/L — ABNORMAL LOW (ref 98–111)
Creatinine, Ser: 1.07 mg/dL (ref 0.61–1.24)
GFR calc Af Amer: >60 mL/min (ref >60)
GFR calc non Af Amer: >60 mL/min (ref >60)
Glucose, Bld: 98 mg/dL (ref 70–99)
Potassium: 3.7 mmol/L (ref 3.5–5.1)
Sodium: 129 mmol/L — ABNORMAL LOW (ref 135–145)
Total Bilirubin: 2.5 mg/dL — ABNORMAL HIGH (ref 0.3–1.2)
Total Protein: 6.2 g/dL — ABNORMAL LOW (ref 6.5–8.1)

## 2019-01-20 LAB — URINALYSIS, ROUTINE W REFLEX MICROSCOPIC
Bacteria, UA: NONE SEEN
Glucose, UA: NEGATIVE mg/dL
Hgb urine dipstick: NEGATIVE
Ketones, ur: 80 mg/dL — AB
Leukocytes,Ua: NEGATIVE
Nitrite: NEGATIVE
Protein, ur: 30 mg/dL — AB
Specific Gravity, Urine: 1.026 (ref 1.005–1.030)
pH: 6 (ref 5.0–8.0)

## 2019-01-20 LAB — CBC WITH DIFFERENTIAL/PLATELET
Abs Immature Granulocytes: 0.14 10*3/uL — ABNORMAL HIGH (ref 0.00–0.07)
Basophils Absolute: 0.1 10*3/uL (ref 0.0–0.1)
Basophils Relative: 0 %
Eosinophils Absolute: 0.1 10*3/uL (ref 0.0–0.5)
Eosinophils Relative: 0 %
HCT: 47.9 % (ref 39.0–52.0)
Hemoglobin: 16.2 g/dL (ref 13.0–17.0)
Immature Granulocytes: 1 %
Lymphocytes Relative: 8 %
Lymphs Abs: 1.5 10*3/uL (ref 0.7–4.0)
MCH: 32.1 pg (ref 26.0–34.0)
MCHC: 33.8 g/dL (ref 30.0–36.0)
MCV: 94.9 fL (ref 80.0–100.0)
Monocytes Absolute: 2.4 10*3/uL — ABNORMAL HIGH (ref 0.1–1.0)
Monocytes Relative: 14 %
Neutro Abs: 13.2 10*3/uL — ABNORMAL HIGH (ref 1.7–7.7)
Neutrophils Relative %: 77 %
Platelets: 189 10*3/uL (ref 150–400)
RBC: 5.05 MIL/uL (ref 4.22–5.81)
RDW: 15.2 % (ref 11.5–15.5)
WBC: 17.4 10*3/uL — ABNORMAL HIGH (ref 4.0–10.5)
nRBC: 0 % (ref 0.0–0.2)

## 2019-01-20 LAB — CBG MONITORING, ED: Glucose-Capillary: 105 mg/dL — ABNORMAL HIGH (ref 70–99)

## 2019-01-20 LAB — CK: Total CK: 2301 U/L — ABNORMAL HIGH (ref 49–397)

## 2019-01-20 LAB — TSH: TSH: 5.848 u[IU]/mL — ABNORMAL HIGH (ref 0.350–4.500)

## 2019-01-20 LAB — AMMONIA: Ammonia: 47 umol/L — ABNORMAL HIGH (ref 9–35)

## 2019-01-20 LAB — ETHANOL: Alcohol, Ethyl (B): <10 mg/dL (ref <10)

## 2019-01-20 MED ORDER — THIAMINE HCL 100 MG/ML IJ SOLN
Freq: Once | INTRAVENOUS | Status: AC
  Administered 2019-01-20: 22:00:00 via INTRAVENOUS
  Filled 2019-01-20: qty 1000

## 2019-01-20 MED ORDER — SODIUM BICARBONATE-DEXTROSE 150-5 MEQ/L-% IV SOLN
150.0000 meq | INTRAVENOUS | Status: DC
  Filled 2019-01-20: qty 1000

## 2019-01-20 MED ORDER — LACTATED RINGERS IV BOLUS
1000.0000 mL | Freq: Once | INTRAVENOUS | Status: DC
  Administered 2019-01-20: 1000 mL via INTRAVENOUS

## 2019-01-20 MED ORDER — ENOXAPARIN SODIUM 40 MG/0.4ML ~~LOC~~ SOLN
40.0000 mg | SUBCUTANEOUS | Status: DC
  Administered 2019-01-21 – 2019-01-22 (×2): 40 mg via SUBCUTANEOUS
  Filled 2019-01-20 (×4): qty 0.4

## 2019-01-20 MED ORDER — ADULT MULTIVITAMIN W/MINERALS CH
1.0000 | ORAL_TABLET | Freq: Every day | ORAL | Status: DC
  Administered 2019-01-21 – 2019-01-25 (×5): 1 via ORAL
  Filled 2019-01-20 (×5): qty 1

## 2019-01-20 MED ORDER — FOLIC ACID 1 MG PO TABS
1.0000 mg | ORAL_TABLET | Freq: Every day | ORAL | Status: DC
  Administered 2019-01-21 – 2019-01-25 (×5): 1 mg via ORAL
  Filled 2019-01-20 (×5): qty 1

## 2019-01-20 MED ORDER — VITAMIN B-1 100 MG PO TABS
100.0000 mg | ORAL_TABLET | Freq: Every day | ORAL | Status: DC
  Administered 2019-01-21: 100 mg via ORAL
  Filled 2019-01-20: qty 1

## 2019-01-20 MED ORDER — ERYTHROMYCIN 5 MG/GM OP OINT
TOPICAL_OINTMENT | Freq: Four times a day (QID) | OPHTHALMIC | Status: DC
  Administered 2019-01-21 – 2019-01-25 (×11): via OPHTHALMIC
  Filled 2019-01-20: qty 3.5

## 2019-01-20 NOTE — ED Triage Notes (Signed)
Pt arrives via [facility] due to being found down at home with altered mental status today by stepdaughter. Per ems, patient lives alone and recently had a welfare check on Friday where he was found normal. Stepdaughter hadn't heard from the patient since Friday. Visited him today and he was found on the floor covered in feces, urine, with ETOH bottles near him.   Patient arrives somewhat lethargic, but awakens to voice. Several bruises and contusions to pts body as well from falls, per the patient.  Hx of HTN and ETOH abuse.

## 2019-01-20 NOTE — H&P (Addendum)
Family Medicine Teaching [facility] Admission History and Physical Service Pager: [phone]  Patient name: Bryce Thornton Medical record number: [id number] Date of birth: Aug 11, 1950 Age: 68 y.o. Gender: male  Primary Care Provider: Driggers, Gerome Apley, PA-C Consultants: None Code Status: Full Preferred Emergency Contact: Camelia Eng (spouse) [phone]  Chief Complaint: Altered mental status  Assessment and Plan: Bryce Thornton is a 68 y.o. male presenting with altered mental status . PMH is significant for  HTN, ETOH abuse.  Altered Mental Status Per EMS patient's family was concerned he had not heard from him in 2 days, was then found down at home surrounded by alcohol containers in urine and feces. Called EMS who brought him to the ED. On exam patient somnolent, orientated to person and place. He is a poor historian. Difficult to arouse but answers questions periodically. Patient's AMS likely secondary to metabolic encephalopathy due to EtOH abuse, however EtOH level on admission <10. There's a possibility the patient has already gone through alcohol withdrawal and had a seizure and is now post ictal. Patient drinks ?5-10 beers daily. Unsure when last alcoholic beverage intake.No known seizure disorder. Labs significant for mildly elevated AST, ammonia only slightly elevated at 47, UDS negative,  CK 2301. UA is negative for acute infection. Head, MaxFacial and Cervical CT negative for injury or fractures, making traumatic injury less likely.  -Admit to MedSurg, attending Dr. McDiarmid -Vital signs per unit -Neuro checks every 4 -CIWA protocol -Blood cultures -Repeat CK in a.m. -Repeat ammonia in a.m. -CMP, lactic acid, hemoglobin A1c -RPR, vitamin B12, folate, vitamin B1 -Hep C antibody -PT/INR/aPTT -PT/OT and social work consult -Banana bag at 125cc/hr -nursing bedside swallow -Consider neuro consult in a.m. - could consider EEG  Hyponatremia-Likely secondary to  malnutrition and EtOH abuse. Labs significant for sodium 129.  Will be contributing factor to altered mental status. -BMP in a.m 11/2   Conjunctivitis Purulent drainage noted to eyes bilaterally. Left eye difficult to open.  Mild conjunctivitis bilaterally.  Likely secondary to bacterial or viral etiology. -Erythromycin ointment both eyes every 6 hours -Warm compresses as needed  Cellulitis likely secondary to poor hygiene Multiple areas of skin lacerations with areas of surrounding erythema.  No obvious drainage, no abscesses appreciated. Extremities are non painful to touch. -Wound care consult -Consult pharmacy for Ancef dosage -CBC in a.m 11/2 -trend Lactic acid -Blood cultures  FEN/GI:  NPO pending swallow eval, advance as tolerated Banana bag in room LR /hr  Prophylaxis: Lovenox  daily   Disposition:  Admit to Med-Surg, Attending Dr. McDiarmid  History of Present Illness:  Bryce Thornton is a 68 y.o. male presenting with altered mental status.  History taken primarily from chart review and ED providers patient for historian.  Patient brought in by EMS.  Report was given that family had not heard from patient for 2days.  Went to check on patient and found him on the floor covered in urine and feces.  EMS was called to bring to ED for further evaluation.  He is a poor historian and denies any pain, chest pain, shortness of breath.  Denies any fever or sick contacts.  He does follow commands and is orientated to person and place.  Confused at times.  States that drinks 5-10 beers daily unsure when last drink was.  Lives with his cat.  Patient denies having any family members.  No known meds.  No known allergies.   In the ED patient was mildly hypothermic with temp at 97*F,  and one episode of hypotension.  Labs significant for hyponatremia, hypochloremia, mild metabolic acidosis, CK 2301, AST 75, total bili 2.5, leukocytosis 17.4 with neutrophilic shift.  Chest x-ray  impressive for COPD, but no active cardiopulmonary disease.  CT head negative for acute intracranial abnormalities.  Atrophy advanced for age with mild chronic microvascular ischemic changes.  CT maxillofacial negative for fractures but significant for mild right-sided facial and temporal region soft tissue edema.  Cervical CT negative for acute findings.  ECG sinus rhythm, no STEMI.  Review Of Systems: Per HPI with the following additions:   Review of Systems  Constitutional: Negative for fever.  Respiratory: Negative for shortness of breath.   Cardiovascular: Negative for chest pain.    Patient Active Problem List   Diagnosis Date Noted  . AMS (altered mental status) 01/20/2019    Past Medical History: Past Medical History:  Diagnosis Date  . Alcohol abuse   . Balance problem   . Cough    CHRONIC  . Hypertension   . Sinus complaint   . Wheezing     Past Surgical History: Past Surgical History:  Procedure Laterality Date  . CATARACT EXTRACTION W/PHACO Right 08/20/2015   Procedure: CATARACT EXTRACTION PHACO AND INTRAOCULAR LENS PLACEMENT (IOC);  Surgeon: Nevada Crane, MD;  Location: [facility] ORS;  Service: Ophthalmology;  Laterality: Right;  Lot #[id number] H [place]: 01:11.0 AP%:18.4 CDE: 13.07  . CATARACT EXTRACTION W/PHACO Left 01/17/2018   Procedure: CATARACT EXTRACTION PHACO AND INTRAOCULAR LENS PLACEMENT (IOC);  Surgeon: Nevada Crane, MD;  Location: [facility] ORS;  Service: Ophthalmology;  Laterality: Left;  [place] 01:22.8 CDE 12.21 Fluid Pack lot # [id number]  . ELBOW SURGERY     TENDON  . TONSILLECTOMY      Social History: Social History   Tobacco Use  . Smoking status: Current Every Day Smoker  . Smokeless tobacco: Never Used  Substance Use Topics  . Alcohol use: Yes    Comment: 6 BEERS PER DAY  . Drug use: Not on file   Additional social history: Lives alone with cat Please also refer to relevant sections of EMR.  Family History: Unknown, patient unable to  provide any family history  Allergies and Medications: Allergies  Allergen Reactions  . Dog Epithelium    No current facility-administered medications on file prior to encounter.    No current outpatient medications on file prior to encounter.    Objective: BP 111/70   Pulse 90   Temp 98.5 F (36.9 C) (Oral)   Resp (!) 27   Ht 5\' 8"  (1.727 m)   Wt 79.4 kg   SpO2 95%   BMI 26.61 kg/m  Exam: General: Disheveled and unkempt 68 year old male, frail Eyes: Pupils pinpoint, mild LEFT conjunctivitis with purulent drainage bilaterally, also right periorbital erythema ENTM: Mucous membranes dry Neck: No lymphadenopathy Cardiovascular: Regular rate and rhythm, no murmurs appreciated, no gallops no rubs. Respiratory: Clear to auscultation bilaterally, no crackles no rhonchi noted, no increased work of breathing Gastrointestinal: Soft, nontender, nondistended, bowel sounds present, no organomegaly MSK: Moves all extremities, 3/5 strength upper and lower extremities, no lower extremity edema Derm: Multiple bruises, erythema, blisters, friction tears, Neuro: Somnolent, orientated to place and person.  Obeys commands periodically.   Left outer thigh   Right inner thigh   Right forearm   Right wrist   Left elbow   Left forearm   Labs and Imaging: CBC BMET  Recent Labs  Lab 01/20/19 1550  WBC 17.4*  HGB 16.2  HCT  47.9  PLT 189   Recent Labs  Lab 01/20/19 1550  NA 129*  K 3.7  CL 92*  CO2 17*  BUN 14  CREATININE 1.07  GLUCOSE 98  CALCIUM 9.1     EKG: Sinus rhythm Probable left atrial enlargement Left anterior fascicular block Abnormal R-wave progression, late transition Vent. rate 92 BPM QRS duration 93 ms QT/QTc 376/466 ms P-R-T axes 70 -45 54  Ct Head Wo Contrast  Result Date: 01/20/2019 CLINICAL DATA:  Pt arrives via [facility] due to being found down at home with altered mental status today by stepdaughter. Per ems, patient lives alone and  recently had a welfare check on Friday where he was found normal. Stepdaughter hadn't heard from the patient since Friday. Visited him today and he was found on the floor covered in feces, urine, with ETOH bottles near him. EXAM: CT HEAD WITHOUT CONTRAST CT MAXILLOFACIAL WITHOUT CONTRAST CT CERVICAL SPINE WITHOUT CONTRAST TECHNIQUE: Multidetector CT imaging of the head, cervical spine, and maxillofacial structures were performed using the standard protocol without intravenous contrast. Multiplanar CT image reconstructions of the cervical spine and maxillofacial structures were also generated. COMPARISON:  Head CT, 11/16/2018. FINDINGS: CT HEAD FINDINGS Brain: No evidence of acute infarction, hemorrhage, hydrocephalus, extra-axial collection or mass lesion/mass effect. There is ventricular sulcal enlargement reflecting mild diffuse atrophy, advanced for age. Mild periventricular white matter hypoattenuation is also noted consistent with chronic microvascular ischemic change. Vascular: No hyperdense vessel or unexpected calcification. Skull: Normal. Negative for fracture or focal lesion. Other: None. CT MAXILLOFACIAL FINDINGS Osseous: No fracture or mandibular dislocation. No destructive process. Orbits: Negative. No traumatic or inflammatory finding. Sinuses: Mucosal thickening lines the inferior frontal, bilateral ethmoid, left sphenoid and bilateral maxillary sinuses, greater on the left. Dependent fluid is seen in the left maxillary sinus. Clear mastoid air cells and middle ear cavities. Soft tissues: Subcutaneous edema noted along the right lateral facial and temporal soft tissues. No mass or defined hematoma. CT CERVICAL SPINE FINDINGS Alignment: Slight reversal the normal cervical lordosis, apex at C5-C6. No subluxation/spondylolisthesis. Skull base and vertebrae: No acute fracture. No primary bone lesion or focal pathologic process. Soft tissues and spinal canal: No prevertebral fluid or swelling. No visible  canal hematoma. Disc levels: Minor loss of disc height with mild spondylotic disc bulging at C3-C4. There is significant loss of the disc space height with partial bony fusion at the C5-C6 and C6-C7 levels. Mild to moderate loss of disc height at C7-T1. There are bilateral facet degenerative changes. No convincing disc herniation. Upper chest: No acute findings. Other: None. IMPRESSION: HEAD CT 1. No acute intracranial abnormalities. 2. Atrophy advanced for age. Mild chronic microvascular ischemic change. MAXILLOFACIAL CT 1. No fractures. 2. Mild right-sided facial and temporal region soft tissue edema. 3. Sinus disease as detailed above. CERVICAL CT 1. No fracture or acute finding. Electronically Signed   By: Amie Portlandavid  Ormond M.D.   On: 01/20/2019 16:41   Ct Cervical Spine Wo Contrast  Result Date: 01/20/2019 CLINICAL DATA:  Pt arrives via [facility] due to being found down at home with altered mental status today by stepdaughter. Per ems, patient lives alone and recently had a welfare check on Friday where he was found normal. Stepdaughter hadn't heard from the patient since Friday. Visited him today and he was found on the floor covered in feces, urine, with ETOH bottles near him. EXAM: CT HEAD WITHOUT CONTRAST CT MAXILLOFACIAL WITHOUT CONTRAST CT CERVICAL SPINE WITHOUT CONTRAST TECHNIQUE: Multidetector CT imaging  of the head, cervical spine, and maxillofacial structures were performed using the standard protocol without intravenous contrast. Multiplanar CT image reconstructions of the cervical spine and maxillofacial structures were also generated. COMPARISON:  Head CT, 11/16/2018. FINDINGS: CT HEAD FINDINGS Brain: No evidence of acute infarction, hemorrhage, hydrocephalus, extra-axial collection or mass lesion/mass effect. There is ventricular sulcal enlargement reflecting mild diffuse atrophy, advanced for age. Mild periventricular white matter hypoattenuation is also noted consistent with chronic  microvascular ischemic change. Vascular: No hyperdense vessel or unexpected calcification. Skull: Normal. Negative for fracture or focal lesion. Other: None. CT MAXILLOFACIAL FINDINGS Osseous: No fracture or mandibular dislocation. No destructive process. Orbits: Negative. No traumatic or inflammatory finding. Sinuses: Mucosal thickening lines the inferior frontal, bilateral ethmoid, left sphenoid and bilateral maxillary sinuses, greater on the left. Dependent fluid is seen in the left maxillary sinus. Clear mastoid air cells and middle ear cavities. Soft tissues: Subcutaneous edema noted along the right lateral facial and temporal soft tissues. No mass or defined hematoma. CT CERVICAL SPINE FINDINGS Alignment: Slight reversal the normal cervical lordosis, apex at C5-C6. No subluxation/spondylolisthesis. Skull base and vertebrae: No acute fracture. No primary bone lesion or focal pathologic process. Soft tissues and spinal canal: No prevertebral fluid or swelling. No visible canal hematoma. Disc levels: Minor loss of disc height with mild spondylotic disc bulging at C3-C4. There is significant loss of the disc space height with partial bony fusion at the C5-C6 and C6-C7 levels. Mild to moderate loss of disc height at C7-T1. There are bilateral facet degenerative changes. No convincing disc herniation. Upper chest: No acute findings. Other: None. IMPRESSION: HEAD CT 1. No acute intracranial abnormalities. 2. Atrophy advanced for age. Mild chronic microvascular ischemic change. MAXILLOFACIAL CT 1. No fractures. 2. Mild right-sided facial and temporal region soft tissue edema. 3. Sinus disease as detailed above. CERVICAL CT 1. No fracture or acute finding. Electronically Signed   By: Lajean Manes M.D.   On: 01/20/2019 16:41   Dg Chest Portable 1 View  Result Date: 01/20/2019 CLINICAL DATA:  Found unconscious. Suspected aspiration. EXAM: PORTABLE CHEST 1 VIEW COMPARISON:  11/16/2018 FINDINGS: The heart size and  mediastinal contours are within normal limits. Pulmonary hyperinflation again seen, consistent with COPD. Both lungs are clear. The visualized skeletal structures are unremarkable. IMPRESSION: COPD.  No active cardiopulmonary disease. Electronically Signed   By: Marlaine Hind M.D.   On: 01/20/2019 15:46   Ct Maxillofacial Wo Contrast  Result Date: 01/20/2019 CLINICAL DATA:  Pt arrives via [facility] due to being found down at home with altered mental status today by stepdaughter. Per ems, patient lives alone and recently had a welfare check on Friday where he was found normal. Stepdaughter hadn't heard from the patient since Friday. Visited him today and he was found on the floor covered in feces, urine, with ETOH bottles near him. EXAM: CT HEAD WITHOUT CONTRAST CT MAXILLOFACIAL WITHOUT CONTRAST CT CERVICAL SPINE WITHOUT CONTRAST TECHNIQUE: Multidetector CT imaging of the head, cervical spine, and maxillofacial structures were performed using the standard protocol without intravenous contrast. Multiplanar CT image reconstructions of the cervical spine and maxillofacial structures were also generated. COMPARISON:  Head CT, 11/16/2018. FINDINGS: CT HEAD FINDINGS Brain: No evidence of acute infarction, hemorrhage, hydrocephalus, extra-axial collection or mass lesion/mass effect. There is ventricular sulcal enlargement reflecting mild diffuse atrophy, advanced for age. Mild periventricular white matter hypoattenuation is also noted consistent with chronic microvascular ischemic change. Vascular: No hyperdense vessel or unexpected calcification. Skull: Normal. Negative for fracture  or focal lesion. Other: None. CT MAXILLOFACIAL FINDINGS Osseous: No fracture or mandibular dislocation. No destructive process. Orbits: Negative. No traumatic or inflammatory finding. Sinuses: Mucosal thickening lines the inferior frontal, bilateral ethmoid, left sphenoid and bilateral maxillary sinuses, greater on the left. Dependent  fluid is seen in the left maxillary sinus. Clear mastoid air cells and middle ear cavities. Soft tissues: Subcutaneous edema noted along the right lateral facial and temporal soft tissues. No mass or defined hematoma. CT CERVICAL SPINE FINDINGS Alignment: Slight reversal the normal cervical lordosis, apex at C5-C6. No subluxation/spondylolisthesis. Skull base and vertebrae: No acute fracture. No primary bone lesion or focal pathologic process. Soft tissues and spinal canal: No prevertebral fluid or swelling. No visible canal hematoma. Disc levels: Minor loss of disc height with mild spondylotic disc bulging at C3-C4. There is significant loss of the disc space height with partial bony fusion at the C5-C6 and C6-C7 levels. Mild to moderate loss of disc height at C7-T1. There are bilateral facet degenerative changes. No convincing disc herniation. Upper chest: No acute findings. Other: None. IMPRESSION: HEAD CT 1. No acute intracranial abnormalities. 2. Atrophy advanced for age. Mild chronic microvascular ischemic change. MAXILLOFACIAL CT 1. No fractures. 2. Mild right-sided facial and temporal region soft tissue edema. 3. Sinus disease as detailed above. CERVICAL CT 1. No fracture or acute finding. Electronically Signed   By: Amie Portland M.D.   On: 01/20/2019 16:41    Dollene Cleveland, DO 01/20/2019, 9:00 PM PGY-1, [facility] FPTS Intern pager: [phone], text pages welcome  FPTS Upper-Level Resident Addendum  I have independently interviewed and examined the patient. I have discussed the above with the original author and agree with their documentation. My edits for correction/addition/clarification are in blue. Please see also any attending notes.   Peggyann Shoals, DO PGY-2, [facility] 01/20/2019 9:00 PM  FPTS Service pager: [phone] (text pages welcome through [facility])

## 2019-01-20 NOTE — Progress Notes (Signed)
Received from ED via stretcher. No family with patient, See assessment.

## 2019-01-20 NOTE — ED Notes (Signed)
Patient daughter calling asking for an update Ovidio Kin [phone]  Please call when we can

## 2019-01-20 NOTE — ED Provider Notes (Signed)
[facility] EMERGENCY DEPARTMENT Provider Note   CSN: [id number] Arrival date & time: 01/20/19  1437     History   Chief Complaint Chief Complaint  Patient presents with   Weakness   Altered Mental Status    HPI Bryce Thornton is a 68 y.o. male.     HPI   Patient presents today with a past medical history of hypertension for altered mental status and possible binge drinking.  Much of handoff is obtained from EMS handoff as patient is confused, states dependence respiratory question asked when questions are repeated, oriented to only person.  Patient's stepdaughter had last seen patient normal on Friday, and she did not hear him for the rest of the day, yesterday, and then yesterday and today for a well-child check.  He was found on the floor, covered in feces, urine, with multiple alcohol empty bottles near him.    When asked directly, patient states he did not drink, but his last alcoholic drink was this morning.  He denies any recreational drug use.  Patient states he drank anywhere from 5 cases of beer to 10 beers.  Past Medical History:  Diagnosis Date   Alcohol abuse    Balance problem    Cough    CHRONIC   Hypertension    Sinus complaint    Wheezing     Patient Active Problem List   Diagnosis Date Noted   AMS (altered mental status) 01/20/2019    Past Surgical History:  Procedure Laterality Date   CATARACT EXTRACTION W/PHACO Right 08/20/2015   Procedure: CATARACT EXTRACTION PHACO AND INTRAOCULAR LENS PLACEMENT (IOC);  Surgeon: Nevada Crane, MD;  Location: [facility] ORS;  Service: Ophthalmology;  Laterality: Right;  Lot #[id number] H [place]: 01:11.0 AP%:18.4 CDE: 13.07   CATARACT EXTRACTION W/PHACO Left 01/17/2018   Procedure: CATARACT EXTRACTION PHACO AND INTRAOCULAR LENS PLACEMENT (IOC);  Surgeon: Nevada Crane, MD;  Location: [facility] ORS;  Service: Ophthalmology;  Laterality: Left;  [place] 01:22.8 CDE 12.21 Fluid Pack lot # [id number]    ELBOW SURGERY     TENDON   TONSILLECTOMY          Home Medications    Prior to Admission medications   Not on File    Family History History reviewed. No pertinent family history.  Social History Social History   Tobacco Use   Smoking status: Current Every Day Smoker   Smokeless tobacco: Never Used  Substance Use Topics   Alcohol use: Yes    Comment: 6 BEERS PER DAY   Drug use: Not on file     Allergies   Dog epithelium   Review of Systems Review of Systems  Unable to perform ROS: Mental status change  Respiratory: Negative for cough and shortness of breath.   Skin: Positive for wound.     Physical Exam Updated Vital Signs BP 117/76    Pulse 98    Temp (!) 97 F (36.1 C) (Rectal)    Resp 18    Ht 5\' 8"  (1.727 m)    Wt 79.4 kg    SpO2 100%    BMI 26.61 kg/m   Physical Exam Vitals signs and nursing note reviewed.  Constitutional:      Appearance: He is well-developed.     Comments: Disheveled appearance, poor hygiene  HENT:     Head: Normocephalic.  Eyes:     Conjunctiva/sclera: Conjunctivae normal.  Neck:     Musculoskeletal: Neck supple.     Comments: Cervical  collar placed during exam Cardiovascular:     Rate and Rhythm: Normal rate and regular rhythm.     Heart sounds: No murmur.  Pulmonary:     Effort: Pulmonary effort is normal. No respiratory distress.     Breath sounds: Normal breath sounds.  Abdominal:     Palpations: Abdomen is soft.     Tenderness: There is no abdominal tenderness.  Skin:    General: Skin is warm and dry.     Comments: Multiple bruises throughout upper extremities, face, lower extremities  Neurological:     Mental Status: He is alert.     GCS: GCS eye subscore is 3. GCS verbal subscore is 4. GCS motor subscore is 6.     Cranial Nerves: No cranial nerve deficit.     Motor: Weakness present.     Comments: GCS 13, generalized weakness, no cranial nerve deficit, oriented to person only      ED Treatments  / Results  Labs (all labs ordered are listed, but only abnormal results are displayed) Labs Reviewed  CBC WITH DIFFERENTIAL/PLATELET - Abnormal; Notable for the following components:      Result Value   WBC 17.4 (*)    Neutro Abs 13.2 (*)    Monocytes Absolute 2.4 (*)    Abs Immature Granulocytes 0.14 (*)    All other components within normal limits  COMPREHENSIVE METABOLIC PANEL - Abnormal; Notable for the following components:   Sodium 129 (*)    Chloride 92 (*)    CO2 17 (*)    Total Protein 6.2 (*)    Albumin 2.8 (*)    AST 75 (*)    Total Bilirubin 2.5 (*)    Anion gap 20 (*)    All other components within normal limits  CK - Abnormal; Notable for the following components:   Total CK 2,301 (*)    All other components within normal limits  CBG MONITORING, ED - Abnormal; Notable for the following components:   Glucose-Capillary 105 (*)    All other components within normal limits  SARS CORONAVIRUS 2 (TAT 6-24 HRS)  ETHANOL  RAPID URINE DRUG SCREEN, HOSP PERFORMED  AMMONIA  URINALYSIS, ROUTINE W REFLEX MICROSCOPIC  PROTIME-INR    EKG EKG Interpretation  Date/Time:  Sunday January 20 2019 14:52:15 EST Ventricular Rate:  96 PR Interval:    QRS Duration: 90 QT Interval:  368 QTC Calculation: 465 R Axis:   -53 Text Interpretation: Poor data quality Left anterior fascicular block Anterior infarct, old No old tracing to compare Confirmed by Isla Pence [phone]) on 01/20/2019 3:25:02 PM   Radiology Ct Head Wo Contrast  Result Date: 01/20/2019 CLINICAL DATA:  Pt arrives via [facility] due to being found down at home with altered mental status today by stepdaughter. Per ems, patient lives alone and recently had a welfare check on Friday where he was found normal. Stepdaughter hadn't heard from the patient since Friday. Visited him today and he was found on the floor covered in feces, urine, with ETOH bottles near him. EXAM: CT HEAD WITHOUT CONTRAST CT MAXILLOFACIAL  WITHOUT CONTRAST CT CERVICAL SPINE WITHOUT CONTRAST TECHNIQUE: Multidetector CT imaging of the head, cervical spine, and maxillofacial structures were performed using the standard protocol without intravenous contrast. Multiplanar CT image reconstructions of the cervical spine and maxillofacial structures were also generated. COMPARISON:  Head CT, 11/16/2018. FINDINGS: CT HEAD FINDINGS Brain: No evidence of acute infarction, hemorrhage, hydrocephalus, extra-axial collection or mass lesion/mass effect. There is ventricular  sulcal enlargement reflecting mild diffuse atrophy, advanced for age. Mild periventricular white matter hypoattenuation is also noted consistent with chronic microvascular ischemic change. Vascular: No hyperdense vessel or unexpected calcification. Skull: Normal. Negative for fracture or focal lesion. Other: None. CT MAXILLOFACIAL FINDINGS Osseous: No fracture or mandibular dislocation. No destructive process. Orbits: Negative. No traumatic or inflammatory finding. Sinuses: Mucosal thickening lines the inferior frontal, bilateral ethmoid, left sphenoid and bilateral maxillary sinuses, greater on the left. Dependent fluid is seen in the left maxillary sinus. Clear mastoid air cells and middle ear cavities. Soft tissues: Subcutaneous edema noted along the right lateral facial and temporal soft tissues. No mass or defined hematoma. CT CERVICAL SPINE FINDINGS Alignment: Slight reversal the normal cervical lordosis, apex at C5-C6. No subluxation/spondylolisthesis. Skull base and vertebrae: No acute fracture. No primary bone lesion or focal pathologic process. Soft tissues and spinal canal: No prevertebral fluid or swelling. No visible canal hematoma. Disc levels: Minor loss of disc height with mild spondylotic disc bulging at C3-C4. There is significant loss of the disc space height with partial bony fusion at the C5-C6 and C6-C7 levels. Mild to moderate loss of disc height at C7-T1. There are bilateral  facet degenerative changes. No convincing disc herniation. Upper chest: No acute findings. Other: None. IMPRESSION: HEAD CT 1. No acute intracranial abnormalities. 2. Atrophy advanced for age. Mild chronic microvascular ischemic change. MAXILLOFACIAL CT 1. No fractures. 2. Mild right-sided facial and temporal region soft tissue edema. 3. Sinus disease as detailed above. CERVICAL CT 1. No fracture or acute finding. Electronically Signed   By: Amie Portlandavid  Ormond M.D.   On: 01/20/2019 16:41   Ct Cervical Spine Wo Contrast  Result Date: 01/20/2019 CLINICAL DATA:  Pt arrives via [facility] due to being found down at home with altered mental status today by stepdaughter. Per ems, patient lives alone and recently had a welfare check on Friday where he was found normal. Stepdaughter hadn't heard from the patient since Friday. Visited him today and he was found on the floor covered in feces, urine, with ETOH bottles near him. EXAM: CT HEAD WITHOUT CONTRAST CT MAXILLOFACIAL WITHOUT CONTRAST CT CERVICAL SPINE WITHOUT CONTRAST TECHNIQUE: Multidetector CT imaging of the head, cervical spine, and maxillofacial structures were performed using the standard protocol without intravenous contrast. Multiplanar CT image reconstructions of the cervical spine and maxillofacial structures were also generated. COMPARISON:  Head CT, 11/16/2018. FINDINGS: CT HEAD FINDINGS Brain: No evidence of acute infarction, hemorrhage, hydrocephalus, extra-axial collection or mass lesion/mass effect. There is ventricular sulcal enlargement reflecting mild diffuse atrophy, advanced for age. Mild periventricular white matter hypoattenuation is also noted consistent with chronic microvascular ischemic change. Vascular: No hyperdense vessel or unexpected calcification. Skull: Normal. Negative for fracture or focal lesion. Other: None. CT MAXILLOFACIAL FINDINGS Osseous: No fracture or mandibular dislocation. No destructive process. Orbits: Negative. No  traumatic or inflammatory finding. Sinuses: Mucosal thickening lines the inferior frontal, bilateral ethmoid, left sphenoid and bilateral maxillary sinuses, greater on the left. Dependent fluid is seen in the left maxillary sinus. Clear mastoid air cells and middle ear cavities. Soft tissues: Subcutaneous edema noted along the right lateral facial and temporal soft tissues. No mass or defined hematoma. CT CERVICAL SPINE FINDINGS Alignment: Slight reversal the normal cervical lordosis, apex at C5-C6. No subluxation/spondylolisthesis. Skull base and vertebrae: No acute fracture. No primary bone lesion or focal pathologic process. Soft tissues and spinal canal: No prevertebral fluid or swelling. No visible canal hematoma. Disc levels: Minor loss of disc height  with mild spondylotic disc bulging at C3-C4. There is significant loss of the disc space height with partial bony fusion at the C5-C6 and C6-C7 levels. Mild to moderate loss of disc height at C7-T1. There are bilateral facet degenerative changes. No convincing disc herniation. Upper chest: No acute findings. Other: None. IMPRESSION: HEAD CT 1. No acute intracranial abnormalities. 2. Atrophy advanced for age. Mild chronic microvascular ischemic change. MAXILLOFACIAL CT 1. No fractures. 2. Mild right-sided facial and temporal region soft tissue edema. 3. Sinus disease as detailed above. CERVICAL CT 1. No fracture or acute finding. Electronically Signed   By: Amie Portland M.D.   On: 01/20/2019 16:41   Dg Chest Portable 1 View  Result Date: 01/20/2019 CLINICAL DATA:  Found unconscious. Suspected aspiration. EXAM: PORTABLE CHEST 1 VIEW COMPARISON:  11/16/2018 FINDINGS: The heart size and mediastinal contours are within normal limits. Pulmonary hyperinflation again seen, consistent with COPD. Both lungs are clear. The visualized skeletal structures are unremarkable. IMPRESSION: COPD.  No active cardiopulmonary disease. Electronically Signed   By: Danae Orleans M.D.    On: 01/20/2019 15:46   Ct Maxillofacial Wo Contrast  Result Date: 01/20/2019 CLINICAL DATA:  Pt arrives via [facility] due to being found down at home with altered mental status today by stepdaughter. Per ems, patient lives alone and recently had a welfare check on Friday where he was found normal. Stepdaughter hadn't heard from the patient since Friday. Visited him today and he was found on the floor covered in feces, urine, with ETOH bottles near him. EXAM: CT HEAD WITHOUT CONTRAST CT MAXILLOFACIAL WITHOUT CONTRAST CT CERVICAL SPINE WITHOUT CONTRAST TECHNIQUE: Multidetector CT imaging of the head, cervical spine, and maxillofacial structures were performed using the standard protocol without intravenous contrast. Multiplanar CT image reconstructions of the cervical spine and maxillofacial structures were also generated. COMPARISON:  Head CT, 11/16/2018. FINDINGS: CT HEAD FINDINGS Brain: No evidence of acute infarction, hemorrhage, hydrocephalus, extra-axial collection or mass lesion/mass effect. There is ventricular sulcal enlargement reflecting mild diffuse atrophy, advanced for age. Mild periventricular white matter hypoattenuation is also noted consistent with chronic microvascular ischemic change. Vascular: No hyperdense vessel or unexpected calcification. Skull: Normal. Negative for fracture or focal lesion. Other: None. CT MAXILLOFACIAL FINDINGS Osseous: No fracture or mandibular dislocation. No destructive process. Orbits: Negative. No traumatic or inflammatory finding. Sinuses: Mucosal thickening lines the inferior frontal, bilateral ethmoid, left sphenoid and bilateral maxillary sinuses, greater on the left. Dependent fluid is seen in the left maxillary sinus. Clear mastoid air cells and middle ear cavities. Soft tissues: Subcutaneous edema noted along the right lateral facial and temporal soft tissues. No mass or defined hematoma. CT CERVICAL SPINE FINDINGS Alignment: Slight reversal the normal  cervical lordosis, apex at C5-C6. No subluxation/spondylolisthesis. Skull base and vertebrae: No acute fracture. No primary bone lesion or focal pathologic process. Soft tissues and spinal canal: No prevertebral fluid or swelling. No visible canal hematoma. Disc levels: Minor loss of disc height with mild spondylotic disc bulging at C3-C4. There is significant loss of the disc space height with partial bony fusion at the C5-C6 and C6-C7 levels. Mild to moderate loss of disc height at C7-T1. There are bilateral facet degenerative changes. No convincing disc herniation. Upper chest: No acute findings. Other: None. IMPRESSION: HEAD CT 1. No acute intracranial abnormalities. 2. Atrophy advanced for age. Mild chronic microvascular ischemic change. MAXILLOFACIAL CT 1. No fractures. 2. Mild right-sided facial and temporal region soft tissue edema. 3. Sinus disease as detailed above. CERVICAL  CT 1. No fracture or acute finding. Electronically Signed   By: Amie Portland M.D.   On: 01/20/2019 16:41    Procedures Procedures (including critical care time)  Medications Ordered in ED Medications  sodium bicarbonate 150 mEq in dextrose 5% 1000 mL infusion (has no administration in time range)     Initial Impression / Assessment and Plan / ED Course  I have reviewed the triage vital signs and the nursing notes.  Pertinent labs & imaging results that were available during my care of the patient were reviewed by me and considered in my medical decision making (see chart for details).        MONTRICE GRACEY is a 68 y.o. male with a past medical history of hypertension reported alcoholism presents today for altered mental status and being found down in his home.  Alcoholism was reported by EMS, but no report of it is found on chart review.  Labs ordered and imaging ordered for differential diagnosis of facial fracture, intracranial bleeding, cervical spine fracture, coagulation abnormality, electrolyte  abnormality, decreased hemoglobin, rhabdomyolysis, sepsis, aspiration pneumonia.  Imaging did not show any acute fractures.  Labs showed an elevated CK, normal creatinine, sodium level of 129 that is decreased (unclear if this is his normal).  Fluids held for creatinine due to low sodium level.  Bicarbonate infusion initiated for elevated CK.  AST mildly elevated at 75, total bilirubin elevated at 2.5.  Leukocytosis present that may be due to trauma.  Chest x-ray did not show any aspiration pneumonia.  Anion gap elevated at 20.  Urine pending, in and out cath ordered.  Ethanol level pending.  Patient is still not at baseline mental status per EMS report.  Unable to reach family at this time.  Will admit to family medicine for further evaluation and management.  Care of patient was discussed with the supervising attending.  Final Clinical Impressions(s) / ED Diagnoses   Final diagnoses:  Altered mental status, unspecified altered mental status type  Elevated CK  Hyponatremia    ED Discharge Orders    None       Chester Holstein, MD 01/20/19 1610    Jacalyn Lefevre, MD 01/20/19 1945

## 2019-01-20 NOTE — ED Notes (Signed)
ED TO INPATIENT HANDOFF REPORT  ED Nurse Name and Phone #: Audreana Hancox [phone]  S Name/Age/Gender Bryce Thornton 68 y.o. male Room/Bed: 022C/022C  Code Status   Code Status: Full Code  Home/SNF/Other Home Patient oriented to: self Is this baseline? No   Triage Complete: Triage complete  Chief Complaint Altered  Triage Note Pt arrives via [facility] due to being found down at home with altered mental status today by stepdaughter. Per ems, patient lives alone and recently had a welfare check on Friday where he was found normal. Stepdaughter hadn't heard from the patient since Friday. Visited him today and he was found on the floor covered in feces, urine, with ETOH bottles near him.   Patient arrives somewhat lethargic, but awakens to voice. Several bruises and contusions to pts body as well from falls, per the patient.  Hx of HTN and ETOH abuse.   Allergies Allergies  Allergen Reactions  . Dog Epithelium     Level of Care/Admitting Diagnosis ED Disposition    ED Disposition Condition Comment   Admit  Hospital Area: [facility] [[id number]]  Level of Care: Med-Surg [16]  Covid Evaluation: Asymptomatic Screening Protocol (No Symptoms)  Diagnosis: AMS (altered mental status) [[id number]]  Admitting Physician: Dana Allan [[id number]]  Attending Physician: MCDIARMID, TODD D [[id number]]  Estimated length of stay: 3 - 4 days  Certification:: I certify this patient will need inpatient services for at least 2 midnights  PT Class (Do Not Modify): Inpatient [101]  PT Acc Code (Do Not Modify): Private [1]       B Medical/Surgery History Past Medical History:  Diagnosis Date  . Alcohol abuse   . Balance problem   . Cough    CHRONIC  . Hypertension   . Sinus complaint   . Wheezing    Past Surgical History:  Procedure Laterality Date  . CATARACT EXTRACTION W/PHACO Right 08/20/2015   Procedure: CATARACT EXTRACTION PHACO AND INTRAOCULAR LENS PLACEMENT (IOC);   Surgeon: Nevada Crane, MD;  Location: [facility] ORS;  Service: Ophthalmology;  Laterality: Right;  Lot #[id number] H [place]: 01:11.0 AP%:18.4 CDE: 13.07  . CATARACT EXTRACTION W/PHACO Left 01/17/2018   Procedure: CATARACT EXTRACTION PHACO AND INTRAOCULAR LENS PLACEMENT (IOC);  Surgeon: Nevada Crane, MD;  Location: [facility] ORS;  Service: Ophthalmology;  Laterality: Left;  [place] 01:22.8 CDE 12.21 Fluid Pack lot # [id number]  . ELBOW SURGERY     TENDON  . TONSILLECTOMY       A IV Location/Drains/Wounds Patient Lines/Drains/Airways Status   Active Line/Drains/Airways    Name:   Placement date:   Placement time:   Site:   Days:   Peripheral IV 01/20/19 Right;Medial;Upper Arm   01/20/19    1756    Arm   less than 1   Incision (Closed) 08/20/15 Eye Right   08/20/15    0912     1249   Incision (Closed) 01/17/18 Eye Left   01/17/18    0658     368          Intake/Output Last 24 hours  Intake/Output Summary (Last 24 hours) at 01/20/2019 2014 Last data filed at 01/20/2019 1851 Gross per 24 hour  Intake -  Output 200 ml  Net -200 ml    Labs/Imaging Results for orders placed or performed during the hospital encounter of 01/20/19 (from the past 48 hour(s))  CBG monitoring, ED     Status: Abnormal   Collection Time: 01/20/19  2:40 PM  Result Value  Ref Range   Glucose-Capillary 105 (H) 70 - 99 mg/dL   Comment 1 Notify RN    Comment 2 Document in Chart   CBC with Differential     Status: Abnormal   Collection Time: 01/20/19  3:50 PM  Result Value Ref Range   WBC 17.4 (H) 4.0 - 10.5 K/uL   RBC 5.05 4.22 - 5.81 MIL/uL   Hemoglobin 16.2 13.0 - 17.0 g/dL   HCT 16.1 09.6 - 04.5 %   MCV 94.9 80.0 - 100.0 fL   MCH 32.1 26.0 - 34.0 pg   MCHC 33.8 30.0 - 36.0 g/dL   RDW 40.9 81.1 - 91.4 %   Platelets 189 150 - 400 K/uL   nRBC 0.0 0.0 - 0.2 %   Neutrophils Relative % 77 %   Neutro Abs 13.2 (H) 1.7 - 7.7 K/uL   Lymphocytes Relative 8 %   Lymphs Abs 1.5 0.7 - 4.0 K/uL   Monocytes Relative 14 %    Monocytes Absolute 2.4 (H) 0.1 - 1.0 K/uL   Eosinophils Relative 0 %   Eosinophils Absolute 0.1 0.0 - 0.5 K/uL   Basophils Relative 0 %   Basophils Absolute 0.1 0.0 - 0.1 K/uL   Immature Granulocytes 1 %   Abs Immature Granulocytes 0.14 (H) 0.00 - 0.07 K/uL    Comment: Performed at [facility], [address], [place], [place] [address]  Comprehensive metabolic panel     Status: Abnormal   Collection Time: 01/20/19  3:50 PM  Result Value Ref Range   Sodium 129 (L) 135 - 145 mmol/L   Potassium 3.7 3.5 - 5.1 mmol/L   Chloride 92 (L) 98 - 111 mmol/L   CO2 17 (L) 22 - 32 mmol/L   Glucose, Bld 98 70 - 99 mg/dL   BUN 14 8 - 23 mg/dL   Creatinine, Ser 6.21 0.61 - 1.24 mg/dL   Calcium 9.1 8.9 - 30.8 mg/dL   Total Protein 6.2 (L) 6.5 - 8.1 g/dL   Albumin 2.8 (L) 3.5 - 5.0 g/dL   AST 75 (H) 15 - 41 U/L   ALT 32 0 - 44 U/L   Alkaline Phosphatase 115 38 - 126 U/L   Total Bilirubin 2.5 (H) 0.3 - 1.2 mg/dL   GFR calc non Af Amer >60 >60 mL/min   GFR calc Af Amer >60 >60 mL/min   Anion gap 20 (H) 5 - 15    Comment: Performed at [facility], [address], [place], [place] [address]  Ethanol     Status: None   Collection Time: 01/20/19  3:50 PM  Result Value Ref Range   Alcohol, Ethyl (B) <10 <10 mg/dL    Comment: (NOTE) Lowest detectable limit for serum alcohol is 10 mg/dL. For medical purposes only. Performed at [facility], [address], [place], [place] [address]   CK     Status: Abnormal   Collection Time: 01/20/19  3:50 PM  Result Value Ref Range   Total CK 2,301 (H) 49 - 397 U/L    Comment: Performed at [facility], [address] [place], [place] [address]  TSH     Status: Abnormal   Collection Time: 01/20/19  3:50 PM  Result Value Ref Range   TSH 5.848 (H) 0.350 - 4.500 uIU/mL    Comment: Performed by a 3rd Generation assay with a functional sensitivity of <=0.01 uIU/mL. Performed at [facility], [address] [place], [place]  [address]   Ammonia     Status: Abnormal   Collection Time: 01/20/19  5:52 PM  Result Value Ref Range   Ammonia 47 (H) 9 - 35 umol/L    Comment: Performed at [facility], [address], [place], [place] [address]  Rapid urine drug screen (hospital performed)     Status: None   Collection Time: 01/20/19  6:52 PM  Result Value Ref Range   Opiates NONE DETECTED NONE DETECTED   Cocaine NONE DETECTED NONE DETECTED   Benzodiazepines NONE DETECTED NONE DETECTED   Amphetamines NONE DETECTED NONE DETECTED   Tetrahydrocannabinol NONE DETECTED NONE DETECTED   Barbiturates NONE DETECTED NONE DETECTED    Comment: (NOTE) DRUG SCREEN FOR MEDICAL PURPOSES ONLY.  IF CONFIRMATION IS NEEDED FOR ANY PURPOSE, NOTIFY LAB WITHIN 5 DAYS. LOWEST DETECTABLE LIMITS FOR URINE DRUG SCREEN Drug Class                     Cutoff (ng/mL) Amphetamine and metabolites    1000 Barbiturate and metabolites    200 Benzodiazepine                 200 Tricyclics and metabolites     300 Opiates and metabolites        300 Cocaine and metabolites        300 THC                            50 Performed at [facility], [address], [place], [place] [address]   Urinalysis, Routine w reflex microscopic     Status: Abnormal   Collection Time: 01/20/19  6:52 PM  Result Value Ref Range   Color, Urine AMBER (A) YELLOW    Comment: BIOCHEMICALS MAY BE AFFECTED BY COLOR   APPearance CLEAR CLEAR   Specific Gravity, Urine 1.026 1.005 - 1.030   pH 6.0 5.0 - 8.0   Glucose, UA NEGATIVE NEGATIVE mg/dL   Hgb urine dipstick NEGATIVE NEGATIVE   Bilirubin Urine SMALL (A) NEGATIVE   Ketones, ur 80 (A) NEGATIVE mg/dL   Protein, ur 30 (A) NEGATIVE mg/dL   Nitrite NEGATIVE NEGATIVE   Leukocytes,Ua NEGATIVE NEGATIVE   RBC / HPF 0-5 0 - 5 RBC/hpf   WBC, UA 6-10 0 - 5 WBC/hpf   Bacteria, UA NONE SEEN NONE SEEN   Hyaline Casts, UA PRESENT     Comment: Performed at [facility], [address], [place], [place]  [address]   Ct Head Wo Contrast  Result Date: 01/20/2019 CLINICAL DATA:  Pt arrives via [facility] due to being found down at home with altered mental status today by stepdaughter. Per ems, patient lives alone and recently had a welfare check on Friday where he was found normal. Stepdaughter hadn't heard from the patient since Friday. Visited him today and he was found on the floor covered in feces, urine, with ETOH bottles near him. EXAM: CT HEAD WITHOUT CONTRAST CT MAXILLOFACIAL WITHOUT CONTRAST CT CERVICAL SPINE WITHOUT CONTRAST TECHNIQUE: Multidetector CT imaging of the head, cervical spine, and maxillofacial structures were performed using the standard protocol without intravenous contrast. Multiplanar CT image reconstructions of the cervical spine and maxillofacial structures were also generated. COMPARISON:  Head CT, 11/16/2018. FINDINGS: CT HEAD FINDINGS Brain: No evidence of acute infarction, hemorrhage, hydrocephalus, extra-axial collection or mass lesion/mass effect. There is ventricular sulcal enlargement reflecting mild diffuse  atrophy, advanced for age. Mild periventricular white matter hypoattenuation is also noted consistent with chronic microvascular ischemic change. Vascular: No hyperdense vessel or unexpected calcification. Skull: Normal. Negative for fracture or focal lesion. Other: None. CT MAXILLOFACIAL FINDINGS Osseous: No fracture or mandibular dislocation. No destructive process. Orbits: Negative. No traumatic or inflammatory finding. Sinuses: Mucosal thickening lines the inferior frontal, bilateral ethmoid, left sphenoid and bilateral maxillary sinuses, greater on the left. Dependent fluid is seen in the left maxillary sinus. Clear mastoid air cells and middle ear cavities. Soft tissues: Subcutaneous edema noted along the right lateral facial and temporal soft tissues. No mass or defined hematoma. CT CERVICAL SPINE FINDINGS Alignment: Slight reversal the normal cervical lordosis, apex at  C5-C6. No subluxation/spondylolisthesis. Skull base and vertebrae: No acute fracture. No primary bone lesion or focal pathologic process. Soft tissues and spinal canal: No prevertebral fluid or swelling. No visible canal hematoma. Disc levels: Minor loss of disc height with mild spondylotic disc bulging at C3-C4. There is significant loss of the disc space height with partial bony fusion at the C5-C6 and C6-C7 levels. Mild to moderate loss of disc height at C7-T1. There are bilateral facet degenerative changes. No convincing disc herniation. Upper chest: No acute findings. Other: None. IMPRESSION: HEAD CT 1. No acute intracranial abnormalities. 2. Atrophy advanced for age. Mild chronic microvascular ischemic change. MAXILLOFACIAL CT 1. No fractures. 2. Mild right-sided facial and temporal region soft tissue edema. 3. Sinus disease as detailed above. CERVICAL CT 1. No fracture or acute finding. Electronically Signed   By: Amie Portland M.D.   On: 01/20/2019 16:41   Ct Cervical Spine Wo Contrast  Result Date: 01/20/2019 CLINICAL DATA:  Pt arrives via [facility] due to being found down at home with altered mental status today by stepdaughter. Per ems, patient lives alone and recently had a welfare check on Friday where he was found normal. Stepdaughter hadn't heard from the patient since Friday. Visited him today and he was found on the floor covered in feces, urine, with ETOH bottles near him. EXAM: CT HEAD WITHOUT CONTRAST CT MAXILLOFACIAL WITHOUT CONTRAST CT CERVICAL SPINE WITHOUT CONTRAST TECHNIQUE: Multidetector CT imaging of the head, cervical spine, and maxillofacial structures were performed using the standard protocol without intravenous contrast. Multiplanar CT image reconstructions of the cervical spine and maxillofacial structures were also generated. COMPARISON:  Head CT, 11/16/2018. FINDINGS: CT HEAD FINDINGS Brain: No evidence of acute infarction, hemorrhage, hydrocephalus, extra-axial collection  or mass lesion/mass effect. There is ventricular sulcal enlargement reflecting mild diffuse atrophy, advanced for age. Mild periventricular white matter hypoattenuation is also noted consistent with chronic microvascular ischemic change. Vascular: No hyperdense vessel or unexpected calcification. Skull: Normal. Negative for fracture or focal lesion. Other: None. CT MAXILLOFACIAL FINDINGS Osseous: No fracture or mandibular dislocation. No destructive process. Orbits: Negative. No traumatic or inflammatory finding. Sinuses: Mucosal thickening lines the inferior frontal, bilateral ethmoid, left sphenoid and bilateral maxillary sinuses, greater on the left. Dependent fluid is seen in the left maxillary sinus. Clear mastoid air cells and middle ear cavities. Soft tissues: Subcutaneous edema noted along the right lateral facial and temporal soft tissues. No mass or defined hematoma. CT CERVICAL SPINE FINDINGS Alignment: Slight reversal the normal cervical lordosis, apex at C5-C6. No subluxation/spondylolisthesis. Skull base and vertebrae: No acute fracture. No primary bone lesion or focal pathologic process. Soft tissues and spinal canal: No prevertebral fluid or swelling. No visible canal hematoma. Disc levels: Minor loss of disc height with mild spondylotic disc bulging  at C3-C4. There is significant loss of the disc space height with partial bony fusion at the C5-C6 and C6-C7 levels. Mild to moderate loss of disc height at C7-T1. There are bilateral facet degenerative changes. No convincing disc herniation. Upper chest: No acute findings. Other: None. IMPRESSION: HEAD CT 1. No acute intracranial abnormalities. 2. Atrophy advanced for age. Mild chronic microvascular ischemic change. MAXILLOFACIAL CT 1. No fractures. 2. Mild right-sided facial and temporal region soft tissue edema. 3. Sinus disease as detailed above. CERVICAL CT 1. No fracture or acute finding. Electronically Signed   By: Amie Portlandavid  Ormond M.D.   On:  01/20/2019 16:41   Dg Chest Portable 1 View  Result Date: 01/20/2019 CLINICAL DATA:  Found unconscious. Suspected aspiration. EXAM: PORTABLE CHEST 1 VIEW COMPARISON:  11/16/2018 FINDINGS: The heart size and mediastinal contours are within normal limits. Pulmonary hyperinflation again seen, consistent with COPD. Both lungs are clear. The visualized skeletal structures are unremarkable. IMPRESSION: COPD.  No active cardiopulmonary disease. Electronically Signed   By: Danae OrleansJohn A Stahl M.D.   On: 01/20/2019 15:46   Ct Maxillofacial Wo Contrast  Result Date: 01/20/2019 CLINICAL DATA:  Pt arrives via [facility] due to being found down at home with altered mental status today by stepdaughter. Per ems, patient lives alone and recently had a welfare check on Friday where he was found normal. Stepdaughter hadn't heard from the patient since Friday. Visited him today and he was found on the floor covered in feces, urine, with ETOH bottles near him. EXAM: CT HEAD WITHOUT CONTRAST CT MAXILLOFACIAL WITHOUT CONTRAST CT CERVICAL SPINE WITHOUT CONTRAST TECHNIQUE: Multidetector CT imaging of the head, cervical spine, and maxillofacial structures were performed using the standard protocol without intravenous contrast. Multiplanar CT image reconstructions of the cervical spine and maxillofacial structures were also generated. COMPARISON:  Head CT, 11/16/2018. FINDINGS: CT HEAD FINDINGS Brain: No evidence of acute infarction, hemorrhage, hydrocephalus, extra-axial collection or mass lesion/mass effect. There is ventricular sulcal enlargement reflecting mild diffuse atrophy, advanced for age. Mild periventricular white matter hypoattenuation is also noted consistent with chronic microvascular ischemic change. Vascular: No hyperdense vessel or unexpected calcification. Skull: Normal. Negative for fracture or focal lesion. Other: None. CT MAXILLOFACIAL FINDINGS Osseous: No fracture or mandibular dislocation. No destructive process.  Orbits: Negative. No traumatic or inflammatory finding. Sinuses: Mucosal thickening lines the inferior frontal, bilateral ethmoid, left sphenoid and bilateral maxillary sinuses, greater on the left. Dependent fluid is seen in the left maxillary sinus. Clear mastoid air cells and middle ear cavities. Soft tissues: Subcutaneous edema noted along the right lateral facial and temporal soft tissues. No mass or defined hematoma. CT CERVICAL SPINE FINDINGS Alignment: Slight reversal the normal cervical lordosis, apex at C5-C6. No subluxation/spondylolisthesis. Skull base and vertebrae: No acute fracture. No primary bone lesion or focal pathologic process. Soft tissues and spinal canal: No prevertebral fluid or swelling. No visible canal hematoma. Disc levels: Minor loss of disc height with mild spondylotic disc bulging at C3-C4. There is significant loss of the disc space height with partial bony fusion at the C5-C6 and C6-C7 levels. Mild to moderate loss of disc height at C7-T1. There are bilateral facet degenerative changes. No convincing disc herniation. Upper chest: No acute findings. Other: None. IMPRESSION: HEAD CT 1. No acute intracranial abnormalities. 2. Atrophy advanced for age. Mild chronic microvascular ischemic change. MAXILLOFACIAL CT 1. No fractures. 2. Mild right-sided facial and temporal region soft tissue edema. 3. Sinus disease as detailed above. CERVICAL CT 1. No fracture or  acute finding. Electronically Signed   By: Lajean Manes M.D.   On: 01/20/2019 16:41    Pending Labs Unresulted Labs (From admission, onward)    Start     Ordered   01/27/19 0500  Creatinine, serum  (enoxaparin (LOVENOX)    CrCl >/= 30 ml/min)  Weekly,   R    Comments: while on enoxaparin therapy    01/20/19 1909   01/21/19 0500  Hemoglobin A1c  Tomorrow morning,   R     01/20/19 1909   01/21/19 0500  Comprehensive metabolic panel  Tomorrow morning,   R     01/20/19 1909   01/21/19 0500  CBC  Tomorrow morning,   R      01/20/19 1909   01/21/19 0500  APTT  Tomorrow morning,   R     01/20/19 1909   01/21/19 0500  Protime-INR  Tomorrow morning,   R     01/20/19 1909   01/21/19 0500  CK  Tomorrow morning,   R     01/20/19 1919   01/20/19 1949  Culture, blood (single)  ONCE - STAT,   STAT     01/20/19 1949   01/20/19 1941  Vitamin B1  Once,   STAT     01/20/19 1941   01/20/19 1941  Folate RBC  Once,   STAT     01/20/19 1941   01/20/19 1940  Vitamin B12  Once,   STAT     01/20/19 1941   01/20/19 1940  RPR  Once,   STAT     01/20/19 1941   01/20/19 1922  Hepatitis C antibody  Once,   STAT     01/20/19 1921   01/20/19 1903  HIV Antibody (routine testing w rflx)  (HIV Antibody (Routine testing w reflex) panel)  Once,   STAT     01/20/19 1909   01/20/19 1856  Lactic acid, plasma  STAT Now then every 3 hours,   R (with STAT occurrences)     01/20/19 1909   01/20/19 1719  SARS CORONAVIRUS 2 (TAT 6-24 HRS) Nasopharyngeal Nasopharyngeal Swab  (Asymptomatic/Tier 2 Patients Labs)  Once,   STAT    Question Answer Comment  Is this test for diagnosis or screening Screening   Symptomatic for COVID-19 as defined by CDC No   Hospitalized for COVID-19 No   Admitted to ICU for COVID-19 No   Previously tested for COVID-19 No   Resident in a congregate (group) care setting No   Employed in healthcare setting No      01/20/19 1718          Vitals/Pain Today's Vitals   01/20/19 1730 01/20/19 1745 01/20/19 1830 01/20/19 1930  BP: 119/88 117/76 133/74 111/70  Pulse: 98  90   Resp: 20 18 17  (!) 27  Temp:      TempSrc:      SpO2: 100%  95%   Weight:      Height:      PainSc:        Isolation Precautions No active isolations  Medications Medications  sodium bicarbonate 150 mEq in dextrose 5% 1000 mL infusion (has no administration in time range)  enoxaparin (LOVENOX) injection 40 mg (has no administration in time range)  folic acid (FOLVITE) tablet 1 mg (has no administration in time range)   multivitamin with minerals tablet 1 tablet (has no administration in time range)  thiamine (VITAMIN B-1) tablet 100 mg (has no administration in time  range)  lactated ringers 1,000 mL with thiamine 100 mg, folic acid 1 mg, multivitamins adult 10 mL infusion (has no administration in time range)  erythromycin ophthalmic ointment (has no administration in time range)    Mobility walks with person assist High fall risk   Focused Assessments Neuro Assessment Handoff:  Swallow screen pass? No  Cardiac Rhythm: Normal sinus rhythm   Last date known well: 01/18/19   Neuro Assessment: Exceptions to WDL Neuro Checks:      Last Documented NIHSS Modified Score:   Has TPA been given? No If patient is a Neuro Trauma and patient is going to OR before floor call report to 4N Charge nurse: [phone] or [phone]     R Recommendations: See Admitting Provider Note  Report given to:   Additional Notes:

## 2019-01-21 ENCOUNTER — Encounter (HOSPITAL_COMMUNITY): Payer: Self-pay | Admitting: Family Medicine

## 2019-01-21 DIAGNOSIS — R400 Somnolence: Secondary | ICD-10-CM

## 2019-01-21 DIAGNOSIS — E8809 Other disorders of plasma-protein metabolism, not elsewhere classified: Secondary | ICD-10-CM

## 2019-01-21 DIAGNOSIS — J439 Emphysema, unspecified: Secondary | ICD-10-CM

## 2019-01-21 DIAGNOSIS — E860 Dehydration: Secondary | ICD-10-CM

## 2019-01-21 DIAGNOSIS — R748 Abnormal levels of other serum enzymes: Secondary | ICD-10-CM

## 2019-01-21 DIAGNOSIS — K089 Disorder of teeth and supporting structures, unspecified: Secondary | ICD-10-CM

## 2019-01-21 DIAGNOSIS — R419 Unspecified symptoms and signs involving cognitive functions and awareness: Secondary | ICD-10-CM

## 2019-01-21 DIAGNOSIS — H10503 Unspecified blepharoconjunctivitis, bilateral: Secondary | ICD-10-CM

## 2019-01-21 DIAGNOSIS — I444 Left anterior fascicular block: Secondary | ICD-10-CM

## 2019-01-21 DIAGNOSIS — T07XXXA Unspecified multiple injuries, initial encounter: Secondary | ICD-10-CM

## 2019-01-21 DIAGNOSIS — R2689 Other abnormalities of gait and mobility: Secondary | ICD-10-CM

## 2019-01-21 DIAGNOSIS — D72829 Elevated white blood cell count, unspecified: Secondary | ICD-10-CM | POA: Insufficient documentation

## 2019-01-21 DIAGNOSIS — R7989 Other specified abnormal findings of blood chemistry: Secondary | ICD-10-CM | POA: Diagnosis present

## 2019-01-21 DIAGNOSIS — F1020 Alcohol dependence, uncomplicated: Secondary | ICD-10-CM

## 2019-01-21 DIAGNOSIS — J324 Chronic pansinusitis: Secondary | ICD-10-CM

## 2019-01-21 DIAGNOSIS — R4189 Other symptoms and signs involving cognitive functions and awareness: Secondary | ICD-10-CM

## 2019-01-21 DIAGNOSIS — R296 Repeated falls: Secondary | ICD-10-CM

## 2019-01-21 DIAGNOSIS — R7401 Elevation of levels of liver transaminase levels: Secondary | ICD-10-CM

## 2019-01-21 DIAGNOSIS — Z79899 Other long term (current) drug therapy: Secondary | ICD-10-CM

## 2019-01-21 DIAGNOSIS — E871 Hypo-osmolality and hyponatremia: Secondary | ICD-10-CM

## 2019-01-21 DIAGNOSIS — E440 Moderate protein-calorie malnutrition: Secondary | ICD-10-CM | POA: Diagnosis present

## 2019-01-21 DIAGNOSIS — D72828 Other elevated white blood cell count: Secondary | ICD-10-CM

## 2019-01-21 DIAGNOSIS — R5381 Other malaise: Secondary | ICD-10-CM

## 2019-01-21 DIAGNOSIS — W19XXXA Unspecified fall, initial encounter: Secondary | ICD-10-CM

## 2019-01-21 DIAGNOSIS — R791 Abnormal coagulation profile: Secondary | ICD-10-CM

## 2019-01-21 DIAGNOSIS — G319 Degenerative disease of nervous system, unspecified: Secondary | ICD-10-CM

## 2019-01-21 DIAGNOSIS — F17200 Nicotine dependence, unspecified, uncomplicated: Secondary | ICD-10-CM

## 2019-01-21 DIAGNOSIS — M6282 Rhabdomyolysis: Secondary | ICD-10-CM

## 2019-01-21 DIAGNOSIS — H109 Unspecified conjunctivitis: Secondary | ICD-10-CM | POA: Diagnosis present

## 2019-01-21 DIAGNOSIS — R531 Weakness: Secondary | ICD-10-CM

## 2019-01-21 DIAGNOSIS — S0083XA Contusion of other part of head, initial encounter: Secondary | ICD-10-CM

## 2019-01-21 DIAGNOSIS — R824 Acetonuria: Secondary | ICD-10-CM

## 2019-01-21 HISTORY — DX: Other abnormalities of gait and mobility: R26.89

## 2019-01-21 HISTORY — DX: Nicotine dependence, unspecified, uncomplicated: F17.200

## 2019-01-21 HISTORY — DX: Disorder of teeth and supporting structures, unspecified: K08.9

## 2019-01-21 HISTORY — DX: Alcohol dependence, uncomplicated: F10.20

## 2019-01-21 HISTORY — DX: Left anterior fascicular block: I44.4

## 2019-01-21 HISTORY — DX: Other symptoms and signs involving cognitive functions and awareness: R41.89

## 2019-01-21 HISTORY — DX: Chronic pansinusitis: J32.4

## 2019-01-21 HISTORY — DX: Repeated falls: R29.6

## 2019-01-21 HISTORY — DX: Unspecified fall, initial encounter: W19.XXXA

## 2019-01-21 HISTORY — DX: Other malaise: R53.81

## 2019-01-21 HISTORY — DX: Acetonuria: R82.4

## 2019-01-21 LAB — RPR: RPR Ser Ql: NONREACTIVE

## 2019-01-21 LAB — COMPREHENSIVE METABOLIC PANEL
ALT: 27 U/L (ref 0–44)
AST: 50 U/L — ABNORMAL HIGH (ref 15–41)
Albumin: 2.5 g/dL — ABNORMAL LOW (ref 3.5–5.0)
Alkaline Phosphatase: 94 U/L (ref 38–126)
Anion gap: 19 — ABNORMAL HIGH (ref 5–15)
BUN: 11 mg/dL (ref 8–23)
CO2: 22 mmol/L (ref 22–32)
Calcium: 8.7 mg/dL — ABNORMAL LOW (ref 8.9–10.3)
Chloride: 92 mmol/L — ABNORMAL LOW (ref 98–111)
Creatinine, Ser: 0.93 mg/dL (ref 0.61–1.24)
GFR calc Af Amer: >60 mL/min (ref >60)
GFR calc non Af Amer: >60 mL/min (ref >60)
Glucose, Bld: 77 mg/dL (ref 70–99)
Potassium: 3.7 mmol/L (ref 3.5–5.1)
Sodium: 133 mmol/L — ABNORMAL LOW (ref 135–145)
Total Bilirubin: 2 mg/dL — ABNORMAL HIGH (ref 0.3–1.2)
Total Protein: 5.5 g/dL — ABNORMAL LOW (ref 6.5–8.1)

## 2019-01-21 LAB — BLOOD CULTURE ID PANEL (REFLEXED)
Acinetobacter baumannii: DETECTED — AB
Candida albicans: NOT DETECTED
Candida glabrata: NOT DETECTED
Candida krusei: NOT DETECTED
Candida parapsilosis: NOT DETECTED
Candida tropicalis: NOT DETECTED
Carbapenem resistance: NOT DETECTED
Enterobacter cloacae complex: NOT DETECTED
Enterobacteriaceae species: NOT DETECTED
Enterococcus species: NOT DETECTED
Escherichia coli: NOT DETECTED
Haemophilus influenzae: NOT DETECTED
Klebsiella oxytoca: NOT DETECTED
Klebsiella pneumoniae: NOT DETECTED
Listeria monocytogenes: NOT DETECTED
Neisseria meningitidis: NOT DETECTED
Proteus species: NOT DETECTED
Pseudomonas aeruginosa: NOT DETECTED
Serratia marcescens: NOT DETECTED
Staphylococcus aureus (BCID): NOT DETECTED
Staphylococcus species: NOT DETECTED
Streptococcus agalactiae: NOT DETECTED
Streptococcus pneumoniae: NOT DETECTED
Streptococcus pyogenes: NOT DETECTED
Streptococcus species: NOT DETECTED

## 2019-01-21 LAB — HEMOGLOBIN A1C
Hgb A1c MFr Bld: 4.4 % — ABNORMAL LOW (ref 4.8–5.6)
Mean Plasma Glucose: 79.58 mg/dL

## 2019-01-21 LAB — CK: Total CK: 1021 U/L — ABNORMAL HIGH (ref 49–397)

## 2019-01-21 LAB — PROTIME-INR
INR: 1.3 — ABNORMAL HIGH (ref 0.8–1.2)
Prothrombin Time: 16 seconds — ABNORMAL HIGH (ref 11.4–15.2)

## 2019-01-21 LAB — HIV ANTIBODY (ROUTINE TESTING W REFLEX): HIV Screen 4th Generation wRfx: NONREACTIVE

## 2019-01-21 LAB — CBC
HCT: 41.8 % (ref 39.0–52.0)
Hemoglobin: 14 g/dL (ref 13.0–17.0)
MCH: 31.7 pg (ref 26.0–34.0)
MCHC: 33.5 g/dL (ref 30.0–36.0)
MCV: 94.8 fL (ref 80.0–100.0)
Platelets: 192 10*3/uL (ref 150–400)
RBC: 4.41 MIL/uL (ref 4.22–5.81)
RDW: 15.3 % (ref 11.5–15.5)
WBC: 13.1 10*3/uL — ABNORMAL HIGH (ref 4.0–10.5)
nRBC: 0 % (ref 0.0–0.2)

## 2019-01-21 LAB — HEPATITIS C ANTIBODY: HCV Ab: NONREACTIVE

## 2019-01-21 LAB — FOLATE: Folate: 16.9 ng/mL (ref >5.9)

## 2019-01-21 LAB — SARS CORONAVIRUS 2 (TAT 6-24 HRS): SARS Coronavirus 2: NEGATIVE

## 2019-01-21 LAB — LACTIC ACID, PLASMA
Lactic Acid, Venous: 1.6 mmol/L (ref 0.5–1.9)
Lactic Acid, Venous: 1.8 mmol/L (ref 0.5–1.9)

## 2019-01-21 LAB — APTT: aPTT: 32 seconds (ref 24–36)

## 2019-01-21 LAB — AMMONIA: Ammonia: 24 umol/L (ref 9–35)

## 2019-01-21 LAB — VITAMIN B12: Vitamin B-12: 213 pg/mL (ref 180–914)

## 2019-01-21 MED ORDER — BACITRACIN ZINC 500 UNIT/GM EX OINT
TOPICAL_OINTMENT | Freq: Two times a day (BID) | CUTANEOUS | Status: DC
  Administered 2019-01-21 – 2019-01-25 (×8): via TOPICAL
  Filled 2019-01-21: qty 28.4

## 2019-01-21 MED ORDER — THIAMINE HCL 100 MG/ML IJ SOLN
500.0000 mg | Freq: Three times a day (TID) | INTRAVENOUS | Status: DC
  Administered 2019-01-21 – 2019-01-22 (×3): 500 mg via INTRAVENOUS
  Filled 2019-01-21 (×5): qty 5

## 2019-01-21 MED ORDER — SODIUM CHLORIDE 0.9 % IV SOLN
3.0000 g | Freq: Four times a day (QID) | INTRAVENOUS | Status: DC
  Administered 2019-01-21 – 2019-01-23 (×7): 3 g via INTRAVENOUS
  Filled 2019-01-21 (×3): qty 3
  Filled 2019-01-21: qty 8
  Filled 2019-01-21 (×2): qty 3
  Filled 2019-01-21: qty 8
  Filled 2019-01-21 (×2): qty 3
  Filled 2019-01-21: qty 8

## 2019-01-21 MED ORDER — ENSURE ENLIVE PO LIQD
237.0000 mL | Freq: Two times a day (BID) | ORAL | Status: DC
  Administered 2019-01-23 – 2019-01-24 (×3): 237 mL via ORAL

## 2019-01-21 NOTE — Evaluation (Signed)
Occupational Therapy Evaluation Patient Details Name: Bryce Thornton MRN: [id number] DOB: 1950/06/03 Today's Date: 01/21/2019    History of Present Illness Patient is a 68 year old male with PMH is significant for  HTN, ETOH abuse. Patient's family became concerned after not hearing from patient for 2 days. Patient found down in his home covered in feces and urine. Patient diagnosed with hyponatremia, conjunctivitis, and cellulitis.    Clinical Impression   Patient is a 68 year old male who states he lives with his spouse 3 dogs and 2 cats, however chart states patient lives alone. Patient is questionable historian, cannot accurately recall month or year. Pt demonstrates decreased sitting and standing balance, decreased strength and safety with self care and functional transfers. Pt was x2 max A stand pivot transfer to bedside chair.     Follow Up Recommendations  SNF    Equipment Recommendations  Other (comment)(defer to future venue)       Precautions / Restrictions Precautions Precautions: Fall Restrictions Weight Bearing Restrictions: No      Mobility Bed Mobility Overal bed mobility: Needs Assistance Bed Mobility: Rolling;Supine to Sit Rolling: Max assist;+2 for physical assistance   Supine to sit: Max assist;+2 for physical assistance     General bed mobility comments: verbal and tactile cues for sequencing  Transfers Overall transfer level: Needs assistance Equipment used: Rolling walker (2 wheeled)(unable to perform transfer with rolling walker) Transfers: Stand Pivot Transfers   Stand pivot transfers: +2 physical assistance;Max assist       General transfer comment: bilateral LE weakness/buckling    Balance Overall balance assessment: Needs assistance Sitting-balance support: Feet supported;Bilateral upper extremity supported Sitting balance-Leahy Scale: Poor     Standing balance support: Bilateral upper extremity supported Standing balance-Leahy  Scale: Poor Standing balance comment: verbal cues for safety and hand placement                           ADL either performed or assessed with clinical judgement   ADL Overall ADL's : Needs assistance/impaired     Grooming: Min guard;Cueing for safety;Sitting   Upper Body Bathing: Min guard;Cueing for safety;Sitting   Lower Body Bathing: Maximal assistance;Cueing for safety;Sitting/lateral leans;+2 for physical assistance   Upper Body Dressing : Min guard;Sitting   Lower Body Dressing: Maximal assistance;+2 for physical assistance;Sitting/lateral leans;Cueing for safety   Toilet Transfer: +2 for physical assistance;Maximal assistance;Stand-pivot   Toileting- Clothing Manipulation and Hygiene: Total assistance;+2 for physical assistance                            Pertinent Vitals/Pain Pain Assessment: Faces Faces Pain Scale: Hurts little more Pain Location: left heel Pain Descriptors / Indicators: Discomfort Pain Intervention(s): Limited activity within patient's tolerance;Monitored during session;Repositioned     Hand Dominance Right   Extremity/Trunk Assessment Upper Extremity Assessment Upper Extremity Assessment: Generalized weakness   Lower Extremity Assessment Lower Extremity Assessment: Defer to PT evaluation       Communication Communication Communication: No difficulties   Cognition Arousal/Alertness: Awake/alert Behavior During Therapy: WFL for tasks assessed/performed Overall Cognitive Status: History of cognitive impairments - at baseline                                 General Comments: (A/O x2)   General Comments  bilateral foot redness, multiple wounds with dressings over left elbow,  thigh            Home Living Family/patient expects to be discharged to:: Private residence Living Arrangements: Other (Comment)(per chart patient lives alone, patient states wife at home) Available Help at Discharge:  Family;Available PRN/intermittently;Other (Comment) Type of Home: House Home Access: Stairs to enter [employer] of Steps: 5 Entrance Stairs-Rails: Right;Left Home Layout: Two level;Able to live on main level with bedroom/bathroom     Bathroom Shower/Tub: Teacher, early years/pre: Standard Bathroom Accessibility: Yes How Accessible: Accessible via walker Home Equipment: [place] - 2 wheels;Cane - single point          Prior Functioning/Environment Level of Independence: Independent with assistive device(s)        Comments: (pt reports using cane mostly sometimes rolling walker)        OT Problem List: Decreased strength;Decreased activity tolerance;Impaired balance (sitting and/or standing);Decreased cognition;Decreased safety awareness      OT Treatment/Interventions: Self-care/ADL training;Therapeutic exercise;DME and/or AE instruction;Therapeutic activities;Patient/family education;Balance training    OT Goals(Current goals can be found in the care plan section) Acute Rehab OT Goals Patient Stated Goal: to get better OT Goal Formulation: With patient Time For Goal Achievement: 02/04/19 Potential to Achieve Goals: Good  OT Frequency: Min 2X/week        Co-evaluation PT/OT/SLP Co-Evaluation/Treatment: Yes Reason for Co-Treatment: To address functional/ADL transfers   OT goals addressed during session: ADL's and self-care      AM-PAC OT "6 Clicks" Daily Activity     Outcome Measure Help from another person eating meals?: None Help from another person taking care of personal grooming?: A Little Help from another person toileting, which includes using toliet, bedpan, or urinal?: A Lot Help from another person bathing (including washing, rinsing, drying)?: A Lot Help from another person to put on and taking off regular upper body clothing?: A Little Help from another person to put on and taking off regular lower body clothing?: A Lot 6 Click  Score: 16   End of Session Equipment Utilized During Treatment: Gait belt Nurse Communication: Mobility status;Need for lift equipment  Activity Tolerance: Patient tolerated treatment well Patient left: in chair;with call bell/phone within reach;with chair alarm set  OT Visit Diagnosis: Unsteadiness on feet (R26.81);Other abnormalities of gait and mobility (R26.89);Muscle weakness (generalized) (M62.81)                Time: 0254-2706 OT Time Calculation (min): 28 min Charges:  OT General Charges $OT Visit: 1 Visit OT Evaluation $OT Eval Moderate Complexity: [place] OT OT office: [place] 01/21/2019, 10:23 AM

## 2019-01-21 NOTE — Progress Notes (Signed)
PHARMACY - PHYSICIAN COMMUNICATION CRITICAL VALUE ALERT - BLOOD CULTURE IDENTIFICATION (BCID)  Bryce Thornton is an 68 y.o. male who presented to [facility] on 01/20/2019 with a chief complaint of altered mental status  Assessment:  68 year old woman found to have acinetobacter baumanii bacteremia.  Possible source is cellulitis.  Patient not on any antibiotics and is afebrile.    Name of physician (or Provider) Contacted: Dr. Susa Simmonds  Current antibiotics: none  Changes to prescribed antibiotics recommended: Start Unasyn 3g IV q6h Recommendations accepted by provider  Results for orders placed or performed during the hospital encounter of 01/20/19  Blood Culture ID Panel (Reflexed) (Collected: 01/20/2019 11:57 PM)  Result Value Ref Range   Enterococcus species NOT DETECTED NOT DETECTED   Listeria monocytogenes NOT DETECTED NOT DETECTED   Staphylococcus species NOT DETECTED NOT DETECTED   Staphylococcus aureus (BCID) NOT DETECTED NOT DETECTED   Streptococcus species NOT DETECTED NOT DETECTED   Streptococcus agalactiae NOT DETECTED NOT DETECTED   Streptococcus pneumoniae NOT DETECTED NOT DETECTED   Streptococcus pyogenes NOT DETECTED NOT DETECTED   Acinetobacter baumannii DETECTED (A) NOT DETECTED   Enterobacteriaceae species NOT DETECTED NOT DETECTED   Enterobacter cloacae complex NOT DETECTED NOT DETECTED   Escherichia coli NOT DETECTED NOT DETECTED   Klebsiella oxytoca NOT DETECTED NOT DETECTED   Klebsiella pneumoniae NOT DETECTED NOT DETECTED   Proteus species NOT DETECTED NOT DETECTED   Serratia marcescens NOT DETECTED NOT DETECTED   Carbapenem resistance NOT DETECTED NOT DETECTED   Haemophilus influenzae NOT DETECTED NOT DETECTED   Neisseria meningitidis NOT DETECTED NOT DETECTED   Pseudomonas aeruginosa NOT DETECTED NOT DETECTED   Candida albicans NOT DETECTED NOT DETECTED   Candida glabrata NOT DETECTED NOT DETECTED   Candida krusei NOT DETECTED NOT DETECTED   Candida  parapsilosis NOT DETECTED NOT DETECTED   Candida tropicalis NOT DETECTED NOT DETECTED    Candie Mile 01/21/2019  3:14 PM

## 2019-01-21 NOTE — Progress Notes (Signed)
Family Medicine Teaching Service Daily Progress Note Intern Pager: [phone]  Patient name: Bryce Thornton Medical record number: [id number] Date of birth: 03/05/1951 Age: 68 y.o. Gender: male  Primary Care Provider: Driggers, Hewitt Shorts, PA-C Consultants: None Code Status: Full  Pt Overview and Major Events to Date:  11/1 - Admitted with AMS  Assessment and Plan: Bryce Thornton is a 68 y.o. male presenting with altered mental status . PMH is significant for  HTN, ETOH abuse.  Altered Mental Status, improved.  Patient had been found down at home, was lethargic and disoriented in the ED. He is alert, cooperative, and better oriented today. There's a possibility the patient has already gone through alcohol withdrawal. He is improving with fluid resuscitation. No known seizure disorder. Ammonia level within normal limits, UDS negative,  CK 2301 down to 1,000. UA is negative for acute infection. Imaging on arrival negative for acute process or fracture. -Vital signs per unit -Neuro checks every 4 -CIWA protocol -Blood cultures - pending -PT/OT and social work consult -Banana bag at 120cc/hr  Hyponatremia, improved.  Likely secondary to malnutrition and EtOH abuse. Na 129 > 133 on 11/2.  -monitor -Continue IV LR   Conjunctivitis, improved Purulent drainage continues in L eye, R eye appears almost back to normal.  -Erythromycin ointment both eyes every 6 hours -Warm compresses as needed  Skin lacerations with surrounding erythema Multiple areas of skin lacerations with areas of surrounding erythema.  No obvious drainage, no abscesses appreciated, patient is afebrile, lactic acid wnl, WBC improved from 17 on admit down to 13 today  -Wound care consult -f/u Blood cultures  FEN/GI:  Banana Bag/LR @120cc /hr  Prophylaxis: Lovenox 40mg  daily  Disposition: pending improvement  Subjective:  Is alert and cooperative during today's encounter. States he lives at home with his  wife, has a step daughter but doesn't know her phone number. He says he has "a bag of meds at home" but didn't bring them with him. He asked what day today is. He is very pleasant, no other concerns or questions at this time.  Objective: Temp:  [97 F (36.1 C)-98.5 F (36.9 C)] 97.7 F (36.5 C) (11/02 0412) Pulse Rate:  [86-98] 87 (11/02 0412) Resp:  [16-27] 18 (11/02 0412) BP: (111-133)/(68-92) 124/76 (11/02 0412) SpO2:  [95 %-100 %] 100 % (11/02 0412) Weight:  [79.4 kg] 79.4 kg (11/01 1458) Physical Exam: General: No apparent distress, nontoxic-appearing, pleasant, cooperative Cardiovascular: RRR, S1-S2 present, no murmurs appreciated Respiratory: CTA bilaterally, normal work of breathing Abdomen: Bowel sounds auscultated Extremities: Lacerations covered per wound care team, full painless range of motion in wrists, no cyanosis edema or deformity noted to lower extremities  Laboratory: Recent Labs  Lab 01/20/19 1550 01/21/19 0355  WBC 17.4* 13.1*  HGB 16.2 14.0  HCT 47.9 41.8  PLT 189 192   Recent Labs  Lab 01/20/19 1550 01/21/19 0355  NA 129* 133*  K 3.7 3.7  CL 92* 92*  CO2 17* 22  BUN 14 11  CREATININE 1.07 0.93  CALCIUM 9.1 8.7*  PROT 6.2* 5.5*  BILITOT 2.5* 2.0*  ALKPHOS 115 94  ALT 32 27  AST 75* 50*  GLUCOSE 98 77   Urinalysis    Component Value Date/Time   COLORURINE AMBER (A) 01/20/2019 1852   APPEARANCEUR CLEAR 01/20/2019 1852   LABSPEC 1.026 01/20/2019 1852   PHURINE 6.0 01/20/2019 1852   GLUCOSEU NEGATIVE 01/20/2019 1852   HGBUR NEGATIVE 01/20/2019 1852   BILIRUBINUR SMALL (A) 01/20/2019 1852  KETONESUR 80 (A) 01/20/2019 1852   PROTEINUR 30 (A) 01/20/2019 1852   NITRITE NEGATIVE 01/20/2019 1852   LEUKOCYTESUR NEGATIVE 01/20/2019 1852   UDS: Negative Ethanol: <10 on admit Lactic Acid: 1.6 > 1.8 HbA1c: pending Folate: Pending B1: pending B12: 213 INR: 1.3 11/2 CK: 2300 > 1000 11/2 Ammonia: 47>24 11/2 Hep C: non reactive HIV: non  reactive RPR: pending   Imaging/Diagnostic Tests: Ct Head Wo Contrast  Result Date: 01/20/2019 CLINICAL DATA:  Pt arrives via [facility] due to being found down at home with altered mental status today by stepdaughter. Per ems, patient lives alone and recently had a welfare check on Friday where he was found normal. Stepdaughter hadn't heard from the patient since Friday. Visited him today and he was found on the floor covered in feces, urine, with ETOH bottles near him. EXAM: CT HEAD WITHOUT CONTRAST CT MAXILLOFACIAL WITHOUT CONTRAST CT CERVICAL SPINE WITHOUT CONTRAST TECHNIQUE: Multidetector CT imaging of the head, cervical spine, and maxillofacial structures were performed using the standard protocol without intravenous contrast. Multiplanar CT image reconstructions of the cervical spine and maxillofacial structures were also generated. COMPARISON:  Head CT, 11/16/2018. FINDINGS: CT HEAD FINDINGS Brain: No evidence of acute infarction, hemorrhage, hydrocephalus, extra-axial collection or mass lesion/mass effect. There is ventricular sulcal enlargement reflecting mild diffuse atrophy, advanced for age. Mild periventricular white matter hypoattenuation is also noted consistent with chronic microvascular ischemic change. Vascular: No hyperdense vessel or unexpected calcification. Skull: Normal. Negative for fracture or focal lesion. Other: None. CT MAXILLOFACIAL FINDINGS Osseous: No fracture or mandibular dislocation. No destructive process. Orbits: Negative. No traumatic or inflammatory finding. Sinuses: Mucosal thickening lines the inferior frontal, bilateral ethmoid, left sphenoid and bilateral maxillary sinuses, greater on the left. Dependent fluid is seen in the left maxillary sinus. Clear mastoid air cells and middle ear cavities. Soft tissues: Subcutaneous edema noted along the right lateral facial and temporal soft tissues. No mass or defined hematoma. CT CERVICAL SPINE FINDINGS Alignment: Slight  reversal the normal cervical lordosis, apex at C5-C6. No subluxation/spondylolisthesis. Skull base and vertebrae: No acute fracture. No primary bone lesion or focal pathologic process. Soft tissues and spinal canal: No prevertebral fluid or swelling. No visible canal hematoma. Disc levels: Minor loss of disc height with mild spondylotic disc bulging at C3-C4. There is significant loss of the disc space height with partial bony fusion at the C5-C6 and C6-C7 levels. Mild to moderate loss of disc height at C7-T1. There are bilateral facet degenerative changes. No convincing disc herniation. Upper chest: No acute findings. Other: None. IMPRESSION: HEAD CT 1. No acute intracranial abnormalities. 2. Atrophy advanced for age. Mild chronic microvascular ischemic change. MAXILLOFACIAL CT 1. No fractures. 2. Mild right-sided facial and temporal region soft tissue edema. 3. Sinus disease as detailed above. CERVICAL CT 1. No fracture or acute finding. Electronically Signed   By: Amie Portland M.D.   On: 01/20/2019 16:41   Ct Cervical Spine Wo Contrast  Result Date: 01/20/2019 CLINICAL DATA:  Pt arrives via [facility] due to being found down at home with altered mental status today by stepdaughter. Per ems, patient lives alone and recently had a welfare check on Friday where he was found normal. Stepdaughter hadn't heard from the patient since Friday. Visited him today and he was found on the floor covered in feces, urine, with ETOH bottles near him. EXAM: CT HEAD WITHOUT CONTRAST CT MAXILLOFACIAL WITHOUT CONTRAST CT CERVICAL SPINE WITHOUT CONTRAST TECHNIQUE: Multidetector CT imaging of the head, cervical  spine, and maxillofacial structures were performed using the standard protocol without intravenous contrast. Multiplanar CT image reconstructions of the cervical spine and maxillofacial structures were also generated. COMPARISON:  Head CT, 11/16/2018. FINDINGS: CT HEAD FINDINGS Brain: No evidence of acute infarction,  hemorrhage, hydrocephalus, extra-axial collection or mass lesion/mass effect. There is ventricular sulcal enlargement reflecting mild diffuse atrophy, advanced for age. Mild periventricular white matter hypoattenuation is also noted consistent with chronic microvascular ischemic change. Vascular: No hyperdense vessel or unexpected calcification. Skull: Normal. Negative for fracture or focal lesion. Other: None. CT MAXILLOFACIAL FINDINGS Osseous: No fracture or mandibular dislocation. No destructive process. Orbits: Negative. No traumatic or inflammatory finding. Sinuses: Mucosal thickening lines the inferior frontal, bilateral ethmoid, left sphenoid and bilateral maxillary sinuses, greater on the left. Dependent fluid is seen in the left maxillary sinus. Clear mastoid air cells and middle ear cavities. Soft tissues: Subcutaneous edema noted along the right lateral facial and temporal soft tissues. No mass or defined hematoma. CT CERVICAL SPINE FINDINGS Alignment: Slight reversal the normal cervical lordosis, apex at C5-C6. No subluxation/spondylolisthesis. Skull base and vertebrae: No acute fracture. No primary bone lesion or focal pathologic process. Soft tissues and spinal canal: No prevertebral fluid or swelling. No visible canal hematoma. Disc levels: Minor loss of disc height with mild spondylotic disc bulging at C3-C4. There is significant loss of the disc space height with partial bony fusion at the C5-C6 and C6-C7 levels. Mild to moderate loss of disc height at C7-T1. There are bilateral facet degenerative changes. No convincing disc herniation. Upper chest: No acute findings. Other: None. IMPRESSION: HEAD CT 1. No acute intracranial abnormalities. 2. Atrophy advanced for age. Mild chronic microvascular ischemic change. MAXILLOFACIAL CT 1. No fractures. 2. Mild right-sided facial and temporal region soft tissue edema. 3. Sinus disease as detailed above. CERVICAL CT 1. No fracture or acute finding.  Electronically Signed   By: Amie Portlandavid  Ormond M.D.   On: 01/20/2019 16:41   Dg Chest Portable 1 View  Result Date: 01/20/2019 CLINICAL DATA:  Found unconscious. Suspected aspiration. EXAM: PORTABLE CHEST 1 VIEW COMPARISON:  11/16/2018 FINDINGS: The heart size and mediastinal contours are within normal limits. Pulmonary hyperinflation again seen, consistent with COPD. Both lungs are clear. The visualized skeletal structures are unremarkable. IMPRESSION: COPD.  No active cardiopulmonary disease. Electronically Signed   By: Danae OrleansJohn A Stahl M.D.   On: 01/20/2019 15:46   Ct Maxillofacial Wo Contrast  Result Date: 01/20/2019 CLINICAL DATA:  Pt arrives via [facility] due to being found down at home with altered mental status today by stepdaughter. Per ems, patient lives alone and recently had a welfare check on Friday where he was found normal. Stepdaughter hadn't heard from the patient since Friday. Visited him today and he was found on the floor covered in feces, urine, with ETOH bottles near him. EXAM: CT HEAD WITHOUT CONTRAST CT MAXILLOFACIAL WITHOUT CONTRAST CT CERVICAL SPINE WITHOUT CONTRAST TECHNIQUE: Multidetector CT imaging of the head, cervical spine, and maxillofacial structures were performed using the standard protocol without intravenous contrast. Multiplanar CT image reconstructions of the cervical spine and maxillofacial structures were also generated. COMPARISON:  Head CT, 11/16/2018. FINDINGS: CT HEAD FINDINGS Brain: No evidence of acute infarction, hemorrhage, hydrocephalus, extra-axial collection or mass lesion/mass effect. There is ventricular sulcal enlargement reflecting mild diffuse atrophy, advanced for age. Mild periventricular white matter hypoattenuation is also noted consistent with chronic microvascular ischemic change. Vascular: No hyperdense vessel or unexpected calcification. Skull: Normal. Negative for fracture or focal lesion. Other:  None. CT MAXILLOFACIAL FINDINGS Osseous: No fracture  or mandibular dislocation. No destructive process. Orbits: Negative. No traumatic or inflammatory finding. Sinuses: Mucosal thickening lines the inferior frontal, bilateral ethmoid, left sphenoid and bilateral maxillary sinuses, greater on the left. Dependent fluid is seen in the left maxillary sinus. Clear mastoid air cells and middle ear cavities. Soft tissues: Subcutaneous edema noted along the right lateral facial and temporal soft tissues. No mass or defined hematoma. CT CERVICAL SPINE FINDINGS Alignment: Slight reversal the normal cervical lordosis, apex at C5-C6. No subluxation/spondylolisthesis. Skull base and vertebrae: No acute fracture. No primary bone lesion or focal pathologic process. Soft tissues and spinal canal: No prevertebral fluid or swelling. No visible canal hematoma. Disc levels: Minor loss of disc height with mild spondylotic disc bulging at C3-C4. There is significant loss of the disc space height with partial bony fusion at the C5-C6 and C6-C7 levels. Mild to moderate loss of disc height at C7-T1. There are bilateral facet degenerative changes. No convincing disc herniation. Upper chest: No acute findings. Other: None. IMPRESSION: HEAD CT 1. No acute intracranial abnormalities. 2. Atrophy advanced for age. Mild chronic microvascular ischemic change. MAXILLOFACIAL CT 1. No fractures. 2. Mild right-sided facial and temporal region soft tissue edema. 3. Sinus disease as detailed above. CERVICAL CT 1. No fracture or acute finding. Electronically Signed   By: Amie Portland M.D.   On: 01/20/2019 16:41    Dollene Cleveland, DO 01/21/2019, 8:07 AM PGY-2, [facility] FPTS Intern pager: [phone], text pages welcome

## 2019-01-21 NOTE — Progress Notes (Signed)
Patient with extensive skin issues as follows: top of left ear a 1 cm x 0.5 cm reddened area with scab. Left and right periorbital redness with crusty discharge from both eyes (left greater than right). Abrasion above left eye. Teeth with decay, cracked and several missing. Left elbow erythema with a 3cm x 3cm and a 4cm x 2 cm unstageable area. Left side of chest with a 5cm x 3 cm stage 2. Left hip/outer thigh: 4cm x 3 cm unstageable with a 1 cm x 2 cm stage 2 in center,a 2 cm x 1 cm unstageable at 0500, a 1 cm x 1 cm to the left of the larger unstageable, a 3 cm x 2 cm unstageable. Left side with erythema 10cm x 5 cm with a 2 cm x 1 cm unstageable and a 2 cm x 1 cm stage 2 .Left side of outer thigh, 8cm x 6 cm unstageable with a mixture of black area and redness. Left knee Stage 2 with abrasion and bruising. Area of redness on outer aspect of calf, but cool to touch and without pain. Nailbeds with feces and thick fungus appearance. 2nd and 3rd toes on both feet are webbed. Bilateral heels red, nonblancheable. Scrotum red,excoriated,swollen with 2 cm x 2 cm Stage 2 on the underside. MASD to groin,scrotum with a yeasty smell and discharge. Right knee with abrasions and bruising and appears differently shaped than right. Healed scar to side of knee. Abrasion to the back of and inner thigh measuring 14 cm x 5 cm. Right hip with black 2 cm x 3 cm unstageable. Right scapula redness. Right wrist and hand with bruising and a 1 cm x 0.5 cm fluid filled blister. Right forearm 1 cm x 1 cm stage 2. Areas cleaned, Mepilex, Barrier cream and Miconazole powder applied to areas.

## 2019-01-21 NOTE — Evaluation (Signed)
Physical Therapy Evaluation Patient Details Name: Bryce Thornton MRN: [id number] DOB: September 17, 1950 Today's Date: 01/21/2019   History of Present Illness  Patient is a 68 year old male with PMH is significant for  HTN, ETOH abuse. Patient's family became concerned after not hearing from patient for 2 days. Patient found down in his home covered in feces and urine. Patient diagnosed with hyponatremia, conjunctivitis, and cellulitis.   Clinical Impression  Pt admitted with above diagnosis. At the time of PT eval pt was able to perform bed mobility and transfers with up to +2 max assist. Pt unable to negotiate a RW at this time, and recommend use of the [employer] with nursing staff for back to bed or transfer to a BSC. Discussed acute PT recommendations with pt and he is agreeable to continued rehab at the SNF level. Pt asking if there were facilities close to home for him to consider, and PT let CSW know of his request. Pt currently with functional limitations due to the deficits listed below (see PT Problem List). Pt will benefit from skilled PT to increase their independence and safety with mobility to allow discharge to the venue listed below.       Follow Up Recommendations SNF;Supervision/Assistance - 24 hour    Equipment Recommendations  None recommended by PT    Recommendations for Other Services       Precautions / Restrictions Precautions Precautions: Fall Restrictions Weight Bearing Restrictions: No      Mobility  Bed Mobility Overal bed mobility: Needs Assistance Bed Mobility: Rolling;Supine to Sit Rolling: Max assist;+2 for physical assistance   Supine to sit: Max assist;+2 for physical assistance     General bed mobility comments: verbal and tactile cues for sequencing. once sitting EOB, pt with posterior lean and 1 instance of LOB to the R where pt collapsed back down onto R elbow.   Transfers Overall transfer level: Needs assistance Equipment used: Rolling walker (2  wheeled);2 person hand held assist Transfers: Squat Pivot Transfers   Stand pivot transfers: +2 physical assistance;Max assist Squat pivot transfers: Max assist;+2 physical assistance;From elevated surface     General transfer comment: Initially attempted with the RW and pt was unable to achieve full standing position 2 posterior lean and feet sliding forward. On second attempt, bed pad used to cradle hips and we were able to squat pivot around without RW.    Ambulation/Gait             General Gait Details: Unable  Stairs            Wheelchair Mobility    Modified Rankin (Stroke Patients Only)       Balance Overall balance assessment: Needs assistance Sitting-balance support: Feet supported;Bilateral upper extremity supported Sitting balance-Leahy Scale: Poor     Standing balance support: Bilateral upper extremity supported Standing balance-Leahy Scale: Poor Standing balance comment: verbal cues for safety and hand placement                             Pertinent Vitals/Pain Pain Assessment: Faces Faces Pain Scale: Hurts little more Pain Location: left heel Pain Descriptors / Indicators: Sharp;Sore Pain Intervention(s): Limited activity within patient's tolerance;Monitored during session;Repositioned    Home Living Family/patient expects to be discharged to:: Private residence Living Arrangements: Other (Comment)(per chart patient lives alone, patient states wife at home) Available Help at Discharge: Family;Available PRN/intermittently Type of Home: House Home Access: Stairs to enter Entrance Stairs-Rails:  Right;Left Entrance Stairs-Number of Steps: 5 Home Layout: Two level;Able to live on main level with bedroom/bathroom Home Equipment: Gilford Rile - 2 wheels;Cane - single point      Prior Function Level of Independence: Independent with assistive device(s)         Comments: (pt reports using cane mostly sometimes rolling walker)      Hand Dominance   Dominant Hand: Right    Extremity/Trunk Assessment   Upper Extremity Assessment Upper Extremity Assessment: Defer to OT evaluation    Lower Extremity Assessment Lower Extremity Assessment: Generalized weakness(Grossly 4-/5 in quads, hamstring. 3/5 Hip flexors)    Cervical / Trunk Assessment Cervical / Trunk Assessment: Other exceptions Cervical / Trunk Exceptions: Forward head posture with rounded shoulders  Communication   Communication: No difficulties  Cognition Arousal/Alertness: Awake/alert Behavior During Therapy: WFL for tasks assessed/performed Overall Cognitive Status: History of cognitive impairments - at baseline                                 General Comments: (A/O x2)      General Comments General comments (skin integrity, edema, etc.): bilateral foot redness, multiple wounds with dressings over left elbow, thigh. No noted open wound or area of obvious pressure injury where pt was compaining of pain on L heel. I did note a blister on R wrist.    Exercises     Assessment/Plan    PT Assessment Patient needs continued PT services  PT Problem List Decreased strength;Decreased range of motion;Decreased activity tolerance;Decreased balance;Decreased mobility;Decreased knowledge of use of DME;Decreased safety awareness;Decreased knowledge of precautions;Decreased cognition;Pain       PT Treatment Interventions DME instruction;Gait training;Functional mobility training;Stair training;Therapeutic activities;Therapeutic exercise;Patient/family education;Balance training;Neuromuscular re-education;Cognitive remediation    PT Goals (Current goals can be found in the Care Plan section)  Acute Rehab PT Goals Patient Stated Goal: to get better PT Goal Formulation: With patient Time For Goal Achievement: 02/04/19 Potential to Achieve Goals: Good    Frequency Min 2X/week   Barriers to discharge        Co-evaluation PT/OT/SLP  Co-Evaluation/Treatment: Yes Reason for Co-Treatment: Necessary to address cognition/behavior during functional activity;For patient/therapist safety;To address functional/ADL transfers PT goals addressed during session: Mobility/safety with mobility;Balance;Proper use of DME OT goals addressed during session: ADL's and self-care       AM-PAC PT "6 Clicks" Mobility  Outcome Measure Help needed turning from your back to your side while in a flat bed without using bedrails?: A Little Help needed moving from lying on your back to sitting on the side of a flat bed without using bedrails?: A Lot Help needed moving to and from a bed to a chair (including a wheelchair)?: A Lot Help needed standing up from a chair using your arms (e.g., wheelchair or bedside chair)?: A Lot Help needed to walk in hospital room?: Total Help needed climbing 3-5 steps with a railing? : Total 6 Click Score: 11    End of Session Equipment Utilized During Treatment: Gait belt Activity Tolerance: Patient limited by fatigue Patient left: in chair;with call bell/phone within reach;with chair alarm set Nurse Communication: Mobility status;Need for lift equipment(Stedy for back to bed) PT Visit Diagnosis: Unsteadiness on feet (R26.81);Muscle weakness (generalized) (M62.81);History of falling (Z91.81);Pain Pain - part of body: Ankle and joints of foot("all over")    Time: 0918-0950 PT Time Calculation (min) (ACUTE ONLY): 32 min   Charges:   PT Evaluation $PT Eval  Moderate Complexity: 1 Mod          Conni Slipper, PT, DPT Acute Rehabilitation Services Pager: [phone] Office: [phone]   Marylynn Pearson 01/21/2019, 1:26 PM

## 2019-01-21 NOTE — Progress Notes (Signed)
Placed a call to the patient's primary care office to get more information about the patient. He has not been seen there since May 02, 2016.  [facility] (Aug. 29, 2020 - Present) [phone] (Work) [phone] (Fax) [address], [place] [address]  No updated contact information   PMH: Tobacco Use HTN Onychomycosis Alcohol Abuse Fam Hx of Diabetes  Meds: Amlodipine 5mg  Metoprolol 50mg  once daily Atorvastatin 20mg   Surgical Hx: Cataracts  NKDA   Bryce Thornton, [place], PGY-2 01/21/2019 3:33 PM

## 2019-01-22 ENCOUNTER — Inpatient Hospital Stay (HOSPITAL_COMMUNITY): Payer: Medicare Other

## 2019-01-22 DIAGNOSIS — R4182 Altered mental status, unspecified: Secondary | ICD-10-CM

## 2019-01-22 DIAGNOSIS — I96 Gangrene, not elsewhere classified: Secondary | ICD-10-CM

## 2019-01-22 DIAGNOSIS — R7881 Bacteremia: Secondary | ICD-10-CM

## 2019-01-22 DIAGNOSIS — K0889 Other specified disorders of teeth and supporting structures: Secondary | ICD-10-CM

## 2019-01-22 DIAGNOSIS — L98499 Non-pressure chronic ulcer of skin of other sites with unspecified severity: Secondary | ICD-10-CM

## 2019-01-22 DIAGNOSIS — L989 Disorder of the skin and subcutaneous tissue, unspecified: Secondary | ICD-10-CM

## 2019-01-22 DIAGNOSIS — F1010 Alcohol abuse, uncomplicated: Secondary | ICD-10-CM

## 2019-01-22 DIAGNOSIS — B9689 Other specified bacterial agents as the cause of diseases classified elsewhere: Secondary | ICD-10-CM

## 2019-01-22 DIAGNOSIS — Z720 Tobacco use: Secondary | ICD-10-CM

## 2019-01-22 LAB — BASIC METABOLIC PANEL
Anion gap: 15 (ref 5–15)
BUN: 6 mg/dL — ABNORMAL LOW (ref 8–23)
CO2: 22 mmol/L (ref 22–32)
Calcium: 8.4 mg/dL — ABNORMAL LOW (ref 8.9–10.3)
Chloride: 97 mmol/L — ABNORMAL LOW (ref 98–111)
Creatinine, Ser: 0.84 mg/dL (ref 0.61–1.24)
GFR calc Af Amer: >60 mL/min (ref >60)
GFR calc non Af Amer: >60 mL/min (ref >60)
Glucose, Bld: 70 mg/dL (ref 70–99)
Potassium: 3.3 mmol/L — ABNORMAL LOW (ref 3.5–5.1)
Sodium: 134 mmol/L — ABNORMAL LOW (ref 135–145)

## 2019-01-22 LAB — FOLATE RBC
Folate, Hemolysate: 582 ng/mL
Folate, RBC: 1382 ng/mL (ref >498)
Hematocrit: 42.1 % (ref 37.5–51.0)

## 2019-01-22 LAB — CBC
HCT: 38 % — ABNORMAL LOW (ref 39.0–52.0)
Hemoglobin: 12.5 g/dL — ABNORMAL LOW (ref 13.0–17.0)
MCH: 31.5 pg (ref 26.0–34.0)
MCHC: 32.9 g/dL (ref 30.0–36.0)
MCV: 95.7 fL (ref 80.0–100.0)
Platelets: 204 10*3/uL (ref 150–400)
RBC: 3.97 MIL/uL — ABNORMAL LOW (ref 4.22–5.81)
RDW: 15.2 % (ref 11.5–15.5)
WBC: 7.4 10*3/uL (ref 4.0–10.5)
nRBC: 0 % (ref 0.0–0.2)

## 2019-01-22 MED ORDER — THIAMINE HCL 100 MG/ML IJ SOLN
250.0000 mg | INTRAVENOUS | Status: DC
  Administered 2019-01-24: 250 mg via INTRAVENOUS
  Filled 2019-01-22: qty 2.5

## 2019-01-22 MED ORDER — FLUTICASONE PROPIONATE 50 MCG/ACT NA SUSP
2.0000 | Freq: Every day | NASAL | Status: DC
  Administered 2019-01-23 – 2019-01-25 (×3): 2 via NASAL
  Filled 2019-01-22: qty 16

## 2019-01-22 MED ORDER — THIAMINE HCL 100 MG/ML IJ SOLN
500.0000 mg | Freq: Three times a day (TID) | INTRAVENOUS | Status: AC
  Administered 2019-01-22 – 2019-01-23 (×3): 500 mg via INTRAVENOUS
  Filled 2019-01-22 (×3): qty 5

## 2019-01-22 MED ORDER — POTASSIUM CHLORIDE CRYS ER 20 MEQ PO TBCR
40.0000 meq | EXTENDED_RELEASE_TABLET | Freq: Once | ORAL | Status: AC
  Administered 2019-01-22: 40 meq via ORAL
  Filled 2019-01-22: qty 2

## 2019-01-22 NOTE — Progress Notes (Addendum)
Received a phone call from Nurse Manager at [facility], daughter [phone] Home [phone] [name] [phone], at hospital at [place] phone [phone]  Care Manager Constance Holster at [facility], in [place] [address], [place], [place] [address] [phone]  Milus Banister, [place], PGY-2 01/22/2019 2:32 PM   UPDATE:   Damaris Schooner with Step Daughter Rica Mote. She is the one who found the patient on the floor in his home, during his wellness check. We updated her on the patient's current medical status. She is taking care of the patient's animals and home in his abcense. The patient's wife, [name], is currently hospitalized at [facility]. She has been on a ventilator for several weeks. Rica Mote reports that the patient had a hospitalization earlier this year for confusion, similar to this hospitalization. Kama lives in [place], [place]. Kama asked that we contact her when patient is improving to consider the process for SNF placement. She would like placement close to her residence if possible.

## 2019-01-22 NOTE — TOC Initial Note (Signed)
Transition of Care [facility]) - Initial/Assessment Note    Patient Details  Name: Bryce Thornton MRN: [id number] Date of Birth: 11/11/1950  [facility]) CM/SW Contact:    Sable Feil, LCSW Phone Number: 01/22/2019, 9:02 AM  Clinical Narrative:   CSW talked with patient on 11/2 regarding discharge disposition and recommendation of ST rehab. [name] was sitting up in a chair and was alert and was able to talk with patient. Patient did not appear  Totally oriented to situation and did give permission for his wife or daughter to be called. CSW had been informed that patient lived alone, however per patient, he lives with his wife Bryce Thornton. After explaining the purpose of ST rehab, [name] was agreeable. Patient could not read the SNF list due to not having his glasses. The facility search process was explained and patient was advised that his family will be contacted.                Expected Discharge Plan: Skilled Nursing Facility Barriers to Discharge: Ship broker, Continued Medical Work up, SNF Pending bed offer   Patient Goals and CMS Choice Patient states their goals for this hospitalization and ongoing recovery are:: Patient did not provide goals CMS Medicare.gov Compare Post Acute Care list provided to:: Other (Comment Required)(List presented to patient, however he reported that he would not be able to read list as does not have his eye glasses with him) Choice offered to / list presented to : Patient  Expected Discharge Plan and Services Expected Discharge Plan: [place] In-house Referral: Clinical Social Work     Living arrangements for the past 2 months: Single Family Home                                     Prior Living Arrangements/Services Living arrangements for the past 2 months: Single Family Home Lives with:: Spouse(Patient reported that he lives with his wife and that she can be contacted. Info in  notes indicate that patient lives alone. CSW given permission to contact his daughter)   Do you feel safe going back to the place where you live?: Yes(Patient initially expressed that he wants to discharge home)      Need for Family Participation in Patient Care: Yes (Comment) Care giver support system in place?: No (comment)   Criminal Activity/Legal Involvement Pertinent to Current Situation/Hospitalization: No - Comment as needed  Activities of Daily Living      Permission Sought/Granted   Permission granted to share information with : Yes, Verbal Permission Granted  Share Information with NAME: Raesean Bartoletti and [place] granted to share info w Relationship: Wife and daughter  Permission granted to share info w Contact Information: Wife - [phone] or Ovidio Kin - [phone]  Emotional Assessment Appearance:: Appears older than stated age Attitude/Demeanor/Rapport: Guarded Affect (typically observed): Calm Orientation: : Oriented to Self, Oriented to Place Alcohol / Substance Use: Tobacco Use, Alcohol Use, Illicit Drugs(Patient reported that he drinks 2 to 10 beer daily, occassionally drink liquor and does not use illicit drugs) Psych Involvement: No (comment)  Admission diagnosis:  Hyponatremia [E87.1] Elevated CK [R74.8] Altered mental status, unspecified altered mental status type [R41.82] AMS (altered mental status) [R41.82] Patient Active Problem List   Diagnosis Date Noted  . Ketonuria 01/21/2019  . Dehydration 01/21/2019  . Hypoalbuminemia 01/21/2019  . Transaminasemia 01/21/2019  .  Multiple abrasions 01/21/2019  . Secondary rhabdomyolysis 01/21/2019  . Leukocytosis 01/21/2019  . Elevated INR 01/21/2019  . Elevated TSH 01/21/2019  . Pansinusitis 01/21/2019  . 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 possible Alcohol Use Disorder, severe 01/21/2019  . Emphysema lung (HCC) 01/21/2019  . Impairment of  balance 01/21/2019  . Decreased strength 01/21/2019  . Impaired cognition 01/21/2019  . Moderate protein-calorie malnutrition (HCC) 01/21/2019  . Falls 01/21/2019  . Debility 01/21/2019  . Cognitive safety issue 01/21/2019  . Facial contusion 01/21/2019  . Conjunctivitis 01/21/2019  . Patient taking unknown medications 01/21/2019  . Tobacco dependence 01/21/2019  . Elevated CK   . Hyponatremia   . AMS (altered mental status) 01/20/2019   PCP:  Karene Fry Gerome Apley, PA-C Pharmacy:   [facility] [place] - [address] [place] [address] Phone: [phone] Fax: [phone]   Social Determinants of Health (SDOH) Interventions  No SDOH interventions needed at this time  Readmission Risk Interventions No flowsheet data found.

## 2019-01-22 NOTE — Discharge Summary (Signed)
Family Medicine Teaching [facility] Discharge Summary  Patient name: Bryce Thornton Medical record number: [id number] Date of birth: 15-Feb-1951 Age: 68 y.o. 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, mild Emphysema lung Poor dentition  Disposition: Discharge to SNF  Discharge Condition: Stable, Improved  Discharge Exam:  General: Patient is seen resting in bed, nontoxic-appearing, sleepy this morning Cardio: RRR, S1-S2 present, no murmurs appreciated Respiratory: CTA bilaterally, comfortable work of breathing Abdomen: Normal bowel sounds appreciated, nontenderness to palpation Skin: Abrasions and lacerations appreciated to bilateral upper and lower extremities, improved appearance and healing well from initial presentation; improved erythema; no drainage or obvious sign of infection Neuro: Patient is oriented to self, location (hospital), says it is year [facility] Course:  Mr. Goodlin was admitted on 01/20/2019 for altered mental status after being found down for up to 3 days.  CT head and neck and MRI brain did not show acute abnormality, although chronic atrophy was seen.  Chronic alcoholism was thought to have contributed to patient's condition, but due to patient's persistent altered mental status, we were unable to collect any history from him.  Blood culture was positive for Acinetobacter, and patient was placed on IV Unasyn.  It was thought that this infection may be from the multiple abrasions patient had on his  bilateral upper and lower extremities.  He was assessed by speech therapy, PT, and OT, who recommended SNF placement.  He was also treated with thiamine for possible Wernicke syndrome.  Issues for Follow Up:  1. Continue working with Physical and Occupational Therapy to build strength and improve independence. 2. Take Thiamine 100mg  3 times daily for 5 days (11/5-11/10), followed by 100mg  daily indefinitely. 3. Check Creatinine and CBC in 1 week (01/31/2019). Patient may have an AKI (elevated Creatinine from the Bactrim, which can be treated by encouraging PO hydration). 4. Patient may benefit from MoCA evaluation once acute episode has improved.  5. Started losartan 25mg  for elevated BP. Consider starting statin.  6. Continue Bactrim 800-160mg  twice daily for Acinetobacter bacteremia for 10 days through Jan 30, 2019.    Significant Procedures: None  Significant Labs and Imaging:  Recent Labs  Lab 01/21/19 0355 01/22/19 0932 01/23/19 0429  WBC 13.1* 7.4 7.5  HGB 14.0 12.5* 12.1*  HCT 41.8  42.1 38.0* 36.9*  PLT 192 204 219   Recent Labs  Lab 01/20/19 1550 01/21/19 0355 01/22/19 0932 01/23/19 0429  NA 129* 133* 134* 136  K 3.7 3.7 3.3* 3.6  CL 92* 92* 97* 99  CO2 17* 22 22 27   GLUCOSE 98 77 70 101*  BUN 14 11 6* 7*  CREATININE 1.07 0.93 0.84 0.69  CALCIUM 9.1 8.7* 8.4* 8.4*  ALKPHOS 115 94  --   --   AST 75* 50*  --   --   ALT 32 27  --   --   ALBUMIN 2.8* 2.5*  --   --    Folate: 16.9 A1c: 4.4  Results/Tests Pending at Time of Discharge: Methylmalonic Acid, Thiamin/Vit B1  Discharge Medications:  Allergies as of 01/25/2019   No Known Allergies     Medication List  TAKE these medications   bacitracin ointment Apply topically 2 (two) times daily.   collagenase ointment Commonly known as: SANTYL Apply topically daily.   erythromycin ophthalmic ointment Place into both eyes every 6 (six) hours for 1 day.   feeding supplement (ENSURE ENLIVE) Liqd Take  237 mLs by mouth 2 (two) times daily with a meal.   folic acid 1 MG tablet Commonly known as: FOLVITE Take 1 tablet (1 mg total) by mouth daily.   losartan 25 MG tablet Commonly known as: Cozaar Take 1 tablet (25 mg total) by mouth daily.   multivitamin with minerals Tabs tablet Take 1 tablet by mouth daily.   nicotine 14 mg/24hr patch Commonly known as: NICODERM CQ - dosed in mg/24 hours Place 1 patch (14 mg total) onto the skin daily.   sulfamethoxazole-trimethoprim 800-160 MG tablet Commonly known as: BACTRIM DS Take 1 tablet by mouth every 12 (twelve) hours for 6 days.   thiamine 100 MG tablet Take 1 tablet (100 mg total) by mouth 3 (three) times daily for 5 days.   thiamine 100 MG tablet Commonly known as: VITAMIN B-1 Take 1 tablet (100 mg total) by mouth daily. Start taking on: January 29, 2019     Discharge Instructions: Please refer to Patient Instructions section of EMR for full details.  Patient was counseled important signs and symptoms that should prompt return to medical care, changes in medications, dietary instructions, activity restrictions, and follow up appointments.   Follow-Up Appointments: Patient is to follow up with his PCP at [facility]  Daisy Floro, [place] 01/25/2019, 9:39 AM PGY-2, [place]

## 2019-01-22 NOTE — Consult Note (Signed)
[facility]       Reason for Consult:  bacteremia   Referring Physician: Dr. 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 bacitracin   Topical BID  . enoxaparin (LOVENOX) injection  40 mg Subcutaneous Q24H  . erythromycin   Both Eyes Q6H  . feeding supplement (ENSURE ENLIVE)  237 mL Oral BID WC  . folic acid  1 mg Oral Daily  . multivitamin with minerals  1 tablet Oral Daily    Recommendations: Continue amp/sulbactam  repeat blood cultures (sent)  Assessment: He has open skin lesions after being found down and now with blood culture positive for Acinetobacter baumannii.  Unusual pathogen from the community.  Continue with amp/sulbactam and will follow sensitivities.     Antibiotics: Amp/sulbactam  HPI: Bryce Thornton is a 68 y.o. male with alcohol abuse, tobacco abuse found down at home with AMS.  Multiple lesions on his arms with significant ulcerations and necrosis.  He does not remember much but tells me these were not present previously.   He had some leukocytosis at 13.1 on admission but has been afebrile.  He is a poor historian.  No reported fever or chills.  Biggest complaint is fatigue.    Review of Systems:  Constitutional: negative for fevers, chills and anorexia Gastrointestinal: negative for nausea and diarrhea  Musculoskeletal: negative for myalgias and arthralgias All other systems reviewed and are negative    Past Medical History:  Diagnosis Date  . Alcohol abuse   . Balance problem   . Cough    CHRONIC  . Debility 01/21/2019  . Falls 01/21/2019  . Hypertension   . Impaired cognition 01/21/2019  . Impairment of balance 01/21/2019  . Ketonuria 01/21/2019  . 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 Pansinusitis 01/21/2019   01/20/19 Head and Maxillofacial CT finding  . Poor dentition 01/21/2019  . Sinus complaint   . Tobacco dependence 01/21/2019  . Wheezing     Social History   Tobacco Use  . Smoking status: Current Every Day Smoker  . Smokeless tobacco: Never Used  Substance Use Topics  . Alcohol use: Yes    Comment: 6 BEERS PER DAY  . Drug use: Not on file    [facility]  No Known Allergies  Physical Exam: Constitutional: in no apparent distress and alert  Vitals:   01/22/19 0900 01/22/19 0946  BP: 109/76 108/70  Pulse: 82 81  Resp: 17 18  Temp: 98 F (36.7 C) 98.4 F (36.9 C)  SpO2: 100% 99%   EYES: anicteric ENMT:no thrush; poor dentition Cardiovascular: Cor RRR Respiratory: CTA B; normal respiratory effort ZO:XWRUGI:soft, nt Musculoskeletal: no edema Skin: negatives: multiple lesions with ulcerations, particularly the left leg as seen in pictures in the chart  Neuro: non-focal  Lab Results  Component Value Date   WBC 7.4 01/22/2019   HGB 12.5 (L) 01/22/2019   HCT 38.0 (L) 01/22/2019   MCV 95.7 01/22/2019   PLT 204 01/22/2019    Lab Results  Component Value Date   CREATININE 0.84 01/22/2019   BUN 6 (L) 01/22/2019   NA 134 (L) 01/22/2019   K 3.3 (L) 01/22/2019   CL 97 (L) 01/22/2019   CO2 22 01/22/2019    Lab Results  Component Value Date   ALT 27 01/21/2019   AST 50 (H) 01/21/2019   ALKPHOS 94 01/21/2019     Microbiology: Recent Results (from the past 240 hour(s))  SARS CORONAVIRUS 2 (TAT 6-24  HRS) Nasopharyngeal Nasopharyngeal Swab     Status: None   Collection Time: 01/20/19  6:53 PM   Specimen: Nasopharyngeal Swab  Result Value Ref Range Status   SARS Coronavirus 2 NEGATIVE NEGATIVE Final    Comment: (NOTE) SARS-CoV-2 target nucleic acids are NOT DETECTED. The SARS-CoV-2 RNA is generally detectable in upper and lower respiratory specimens during the acute phase of infection. Negative results do not preclude SARS-CoV-2 infection, do not rule out co-infections with other pathogens, and should not be used as the sole basis for treatment or other patient management decisions. Negative results must be combined with clinical observations, patient history, and epidemiological information. The expected result is Negative. Fact Sheet for Patients: SugarRoll.be Fact Sheet for Healthcare Providers: https://www.woods-mathews.com/ This test is not yet approved or cleared by the [place] FDA and  has been authorized for detection and/or diagnosis of SARS-CoV-2 by FDA under an Emergency Use Authorization (EUA). This EUA will remain  in effect (meaning this test can be used) for the duration of the COVID-19 declaration under Section 56 4(b)(1) of the Act, 21 U.S.C. section 360bbb-3(b)(1), unless the authorization is terminated or revoked sooner. Performed at [facility], [place] [address], [place], [place] [address]   Culture, blood (single)     Status: Abnormal (Preliminary result)   Collection Time: 01/20/19 11:57 PM   Specimen: BLOOD  Result Value Ref Range Status   Specimen Description BLOOD LEFT ANTECUBITAL  Final   Special Requests   Final    BOTTLES DRAWN AEROBIC ONLY Blood Culture results may not be optimal due to an inadequate volume of blood received in culture bottles   Culture  Setup Time   Final    GRAM NEGATIVE COCCOBACILLI AEROBIC BOTTLE ONLY CRITICAL RESULT CALLED TO, READ BACK BY AND VERIFIED WITH: J. FRENS,  PHARMD AT 1510 ON 01/21/19 BY C. JESSUP, MT. Performed at [facility], [place] [address] [place], [place] [address]    Culture ACINETOBACTER BAUMANNII (A)  Final   Report Status PENDING  Incomplete  Blood Culture ID Panel (Reflexed)     Status: Abnormal   Collection Time: 01/20/19 11:57 PM  Result Value Ref Range Status   Enterococcus species NOT DETECTED NOT DETECTED Final   Listeria monocytogenes NOT DETECTED NOT DETECTED Final   Staphylococcus species NOT DETECTED NOT DETECTED Final   Staphylococcus aureus (BCID) NOT DETECTED NOT DETECTED Final   Streptococcus species NOT DETECTED NOT DETECTED Final   Streptococcus agalactiae NOT DETECTED NOT DETECTED Final   Streptococcus pneumoniae NOT DETECTED NOT DETECTED Final   Streptococcus pyogenes NOT DETECTED NOT DETECTED Final   Acinetobacter baumannii DETECTED (A) NOT DETECTED Final    Comment: CRITICAL RESULT CALLED TO, READ BACK BY AND VERIFIED WITH: J. FRENS, [place]  ON 01/21/19 BY C. JESSUP, MT.    Enterobacteriaceae species NOT DETECTED NOT DETECTED Final   Enterobacter cloacae complex NOT DETECTED NOT DETECTED Final   Escherichia coli NOT DETECTED NOT DETECTED Final   Klebsiella oxytoca NOT DETECTED NOT DETECTED Final   Klebsiella pneumoniae NOT DETECTED NOT DETECTED Final   Proteus species NOT DETECTED NOT DETECTED Final   Serratia marcescens NOT DETECTED NOT DETECTED Final   Carbapenem resistance NOT DETECTED NOT DETECTED Final   Haemophilus influenzae NOT DETECTED NOT DETECTED Final   Neisseria meningitidis NOT DETECTED NOT DETECTED Final   Pseudomonas aeruginosa NOT DETECTED NOT DETECTED Final   Candida albicans NOT DETECTED NOT DETECTED Final   Candida glabrata NOT DETECTED NOT DETECTED Final   Candida krusei NOT DETECTED NOT DETECTED Final   Candida parapsilosis NOT DETECTED NOT DETECTED Final   Candida tropicalis NOT DETECTED NOT DETECTED Final    Comment: Performed at [facility], [address]  [place], [place] [address]  Culture, blood (routine x 2)     Status: None (Preliminary result)   Collection Time: 01/21/19  4:21 PM   Specimen: BLOOD RIGHT ARM  Result Value Ref Range Status   Specimen Description BLOOD RIGHT ARM  Final   Special Requests   Final    BOTTLES DRAWN AEROBIC ONLY Blood Culture adequate volume   Culture   Final    NO GROWTH < 24 HOURS Performed at [facility], [address], [place], [place] [address]    Report Status PENDING  Incomplete  Culture, blood (routine x 2)     Status: None (Preliminary result)   Collection Time: 01/21/19  4:21 PM   Specimen: BLOOD RIGHT HAND  Result Value Ref Range Status   Specimen Description BLOOD RIGHT HAND  Final   Special Requests   Final    BOTTLES DRAWN AEROBIC ONLY Blood Culture results may not be optimal due to an inadequate volume of blood received in culture bottles   Culture   Final    NO GROWTH < 24 HOURS Performed at [facility], [address] [place], [place] [address]    Report Status PENDING  Incomplete    Gardiner Barefoot, MD [facility] [email or web address] 01/22/2019, 1:11 PM

## 2019-01-22 NOTE — Progress Notes (Addendum)
Family Medicine Teaching Service Daily Progress Note Intern Pager: [phone]  Patient name: Bryce Thornton Medical record number: [id number] Date of birth: 03/22/1950 Age: 68 y.o. Gender: male  Primary Care Provider: Driggers, Gerome Apley, PA-C Consultants: None Code Status: Full  Pt Overview and Major Events to Date:  11/1 - Admitted with AMS  Assessment and Plan: ATHONY Thornton is a 68 y.o. male presenting with altered mental status . PMH is significant for  HTN, ETOH abuse.  Altered Mental Status, improved.  Overnight, no acute events and CIWA 0. Patient had been found down at home, was lethargic and disoriented in the ED. CT Head, Neck, Maxofacial showed no acute change.  He is alert and oriented x3.  There seems to be a long-term memory deficit, patient stated that wife was in the hospital.  He did not member which hospital it was, but she was therefore, or how long she had been there.  Patient said he cannot see his wife and nearly a year.  Patient states that he has middle school aged children at home and no one else to take care of him.  Although possible, given his age, it seems unlikely that he would have middle school-aged children.  Work-up for AMS likely due to possible alcohol withdrawal, which patient may have already gone through.  Possibly due to Acinetobacter bacteremia, but does not appear to be symptomatic from this.  Patient with stable vitals and afebrile.  Remainder of work-up thus far has been negative with normal head CT, MRI pending, UDS negative, electrolytes relatively stable, EKG without abnormalities causing issue..  There's a possibility the patient has already gone through alcohol withdrawal. He is improving with fluid resuscitation. No known seizure disorder. Ammonia level within normal limits UA negative. Imaging on arrival negative for acute process or fracture.  Patient states that he has no one at home to take care of him, and that he is willing to go to SNF  if required. -Vital signs per unit -Neuro checks every 4 -CIWA protocol -PT/OT - recommending SNF/24 hr supervision -SW for SNF placement -SLP eval for MOCA -IV thiamine 500 mg q8h for 48 hrs, then decrease to 250 mg q24h for 5 days  Acinetobacter baumannii bacteremia Placed on IV Unasyn per ID pharmacist. Susceptibilities pending. VSS. Afebrile. CBC pending, but no leukocytosis yesterday.  - follow blood cultures - ID consulted, Dr. Joylene Grapes, appreciate recommendations  Hyponatremia, stable.  Likely beer potomania,  -monitor  Hypokalemia Mild 3.3. Repleted - monitor w/ BMP  Conjunctivitis, improved Purulent drainage continues in L eye, R eye appears almost back to normal.  -Erythromycin ointment both eyes every 6 hours -Warm compresses as needed  Skin lacerations with surrounding erythema Multiple areas of skin lacerations with areas of surrounding erythema.  Patient has small bulla on right wrist, it is nontender. No obvious drainage, no abscesses appreciated, patient is afebrile, lactic acid wnl, WBC improved from 17 on admit down to 13 today  -Wound care consult -Continue Unasyn  Left knee pain Although denied it to me, did endorse to the nurse that he had left knee pain.  Does have multiple abrasions over the knee, has pain with range of motion.  No focal tenderness.  Declined any pain medication.  Given patient was found down at home. Got Left knee xray, but negative for fracture.  - monitor for pain - PT  FEN/GI:  Soft diet due to poor dentition  Prophylaxis: Lovenox 40mg  daily  Disposition: pending improvement in mental  status  Subjective:  Accidentally mispronounced patients name incorrectly as "round-a-bush".  When asking patient how he is doing, he said that he could do without the "n", referring to my mispronunciation of his name.  Patient denies any complaints of pain or any other issue.  Objective: Temp:  [98 F (36.7 C)-98.4 F (36.9 C)] 98.4 F (36.9  C) (11/03 0418) Pulse Rate:  [89-97] 89 (11/03 0418) Resp:  [16-18] 16 (11/03 0418) BP: (106-120)/(71-80) 119/71 (11/03 0418) SpO2:  [95 %-100 %] 99 % (11/03 0418) Physical Exam: General: No apparent distress, nontoxic-appearing, pleasant, cooperative Cardiovascular: RRR, S1-S2 present, no murmurs appreciated Respiratory: CTA bilaterally, normal work of breathing Abdomen: Bowel sounds auscultated Skin: Right wrist has bulla with surrounding erythema, nontender, not warm Extremities: Lacerations covered per wound care team, full painless range of motion in wrists, left knee has some pain with range of motion, no focal tenderness or swelling,   Laboratory: Recent Labs  Lab 01/20/19 1550 01/21/19 0355  WBC 17.4* 13.1*  HGB 16.2 14.0  HCT 47.9 41.8  PLT 189 192   Recent Labs  Lab 01/20/19 1550 01/21/19 0355  NA 129* 133*  K 3.7 3.7  CL 92* 92*  CO2 17* 22  BUN 14 11  CREATININE 1.07 0.93  CALCIUM 9.1 8.7*  PROT 6.2* 5.5*  BILITOT 2.5* 2.0*  ALKPHOS 115 94  ALT 32 27  AST 75* 50*  GLUCOSE 98 77   Urinalysis    Component Value Date/Time   COLORURINE AMBER (A) 01/20/2019 1852   APPEARANCEUR CLEAR 01/20/2019 1852   LABSPEC 1.026 01/20/2019 1852   PHURINE 6.0 01/20/2019 1852   GLUCOSEU NEGATIVE 01/20/2019 1852   HGBUR NEGATIVE 01/20/2019 1852   BILIRUBINUR SMALL (A) 01/20/2019 1852   KETONESUR 80 (A) 01/20/2019 1852   PROTEINUR 30 (A) 01/20/2019 1852   NITRITE NEGATIVE 01/20/2019 1852   LEUKOCYTESUR NEGATIVE 01/20/2019 1852   UDS: Negative Ethanol: <10 on admit Lactic Acid: 1.6 > 1.8 HbA1c: 4 Folate: 16 B1: pending B12: 213 INR: 1.3 11/2 CK: 2300 > 1000 11/2 Ammonia: 47>24 11/2 Hep C: non reactive HIV: non reactive RPR: pending   Imaging/Diagnostic Tests: Ct Head Wo Contrast  Result Date: 01/20/2019 CLINICAL DATA:  Pt arrives via [facility] due to being found down at home with altered mental status today by stepdaughter. Per ems, patient lives alone  and recently had a welfare check on Friday where he was found normal. Stepdaughter hadn't heard from the patient since Friday. Visited him today and he was found on the floor covered in feces, urine, with ETOH bottles near him. EXAM: CT HEAD WITHOUT CONTRAST CT MAXILLOFACIAL WITHOUT CONTRAST CT CERVICAL SPINE WITHOUT CONTRAST TECHNIQUE: Multidetector CT imaging of the head, cervical spine, and maxillofacial structures were performed using the standard protocol without intravenous contrast. Multiplanar CT image reconstructions of the cervical spine and maxillofacial structures were also generated. COMPARISON:  Head CT, 11/16/2018. FINDINGS: CT HEAD FINDINGS Brain: No evidence of acute infarction, hemorrhage, hydrocephalus, extra-axial collection or mass lesion/mass effect. There is ventricular sulcal enlargement reflecting mild diffuse atrophy, advanced for age. Mild periventricular white matter hypoattenuation is also noted consistent with chronic microvascular ischemic change. Vascular: No hyperdense vessel or unexpected calcification. Skull: Normal. Negative for fracture or focal lesion. Other: None. CT MAXILLOFACIAL FINDINGS Osseous: No fracture or mandibular dislocation. No destructive process. Orbits: Negative. No traumatic or inflammatory finding. Sinuses: Mucosal thickening lines the inferior frontal, bilateral ethmoid, left sphenoid and bilateral maxillary sinuses, greater on the left. Dependent  fluid is seen in the left maxillary sinus. Clear mastoid air cells and middle ear cavities. Soft tissues: Subcutaneous edema noted along the right lateral facial and temporal soft tissues. No mass or defined hematoma. CT CERVICAL SPINE FINDINGS Alignment: Slight reversal the normal cervical lordosis, apex at C5-C6. No subluxation/spondylolisthesis. Skull base and vertebrae: No acute fracture. No primary bone lesion or focal pathologic process. Soft tissues and spinal canal: No prevertebral fluid or swelling. No  visible canal hematoma. Disc levels: Minor loss of disc height with mild spondylotic disc bulging at C3-C4. There is significant loss of the disc space height with partial bony fusion at the C5-C6 and C6-C7 levels. Mild to moderate loss of disc height at C7-T1. There are bilateral facet degenerative changes. No convincing disc herniation. Upper chest: No acute findings. Other: None. IMPRESSION: HEAD CT 1. No acute intracranial abnormalities. 2. Atrophy advanced for age. Mild chronic microvascular ischemic change. MAXILLOFACIAL CT 1. No fractures. 2. Mild right-sided facial and temporal region soft tissue edema. 3. Sinus disease as detailed above. CERVICAL CT 1. No fracture or acute finding. Electronically Signed   By: Lajean Manes M.D.   On: 01/20/2019 16:41   Ct Cervical Spine Wo Contrast  Result Date: 01/20/2019 CLINICAL DATA:  Pt arrives via [facility] due to being found down at home with altered mental status today by stepdaughter. Per ems, patient lives alone and recently had a welfare check on Friday where he was found normal. Stepdaughter hadn't heard from the patient since Friday. Visited him today and he was found on the floor covered in feces, urine, with ETOH bottles near him. EXAM: CT HEAD WITHOUT CONTRAST CT MAXILLOFACIAL WITHOUT CONTRAST CT CERVICAL SPINE WITHOUT CONTRAST TECHNIQUE: Multidetector CT imaging of the head, cervical spine, and maxillofacial structures were performed using the standard protocol without intravenous contrast. Multiplanar CT image reconstructions of the cervical spine and maxillofacial structures were also generated. COMPARISON:  Head CT, 11/16/2018. FINDINGS: CT HEAD FINDINGS Brain: No evidence of acute infarction, hemorrhage, hydrocephalus, extra-axial collection or mass lesion/mass effect. There is ventricular sulcal enlargement reflecting mild diffuse atrophy, advanced for age. Mild periventricular white matter hypoattenuation is also noted consistent with chronic  microvascular ischemic change. Vascular: No hyperdense vessel or unexpected calcification. Skull: Normal. Negative for fracture or focal lesion. Other: None. CT MAXILLOFACIAL FINDINGS Osseous: No fracture or mandibular dislocation. No destructive process. Orbits: Negative. No traumatic or inflammatory finding. Sinuses: Mucosal thickening lines the inferior frontal, bilateral ethmoid, left sphenoid and bilateral maxillary sinuses, greater on the left. Dependent fluid is seen in the left maxillary sinus. Clear mastoid air cells and middle ear cavities. Soft tissues: Subcutaneous edema noted along the right lateral facial and temporal soft tissues. No mass or defined hematoma. CT CERVICAL SPINE FINDINGS Alignment: Slight reversal the normal cervical lordosis, apex at C5-C6. No subluxation/spondylolisthesis. Skull base and vertebrae: No acute fracture. No primary bone lesion or focal pathologic process. Soft tissues and spinal canal: No prevertebral fluid or swelling. No visible canal hematoma. Disc levels: Minor loss of disc height with mild spondylotic disc bulging at C3-C4. There is significant loss of the disc space height with partial bony fusion at the C5-C6 and C6-C7 levels. Mild to moderate loss of disc height at C7-T1. There are bilateral facet degenerative changes. No convincing disc herniation. Upper chest: No acute findings. Other: None. IMPRESSION: HEAD CT 1. No acute intracranial abnormalities. 2. Atrophy advanced for age. Mild chronic microvascular ischemic change. MAXILLOFACIAL CT 1. No fractures. 2. Mild right-sided facial  and temporal region soft tissue edema. 3. Sinus disease as detailed above. CERVICAL CT 1. No fracture or acute finding. Electronically Signed   By: Amie Portlandavid  Ormond M.D.   On: 01/20/2019 16:41   Dg Chest Portable 1 View  Result Date: 01/20/2019 CLINICAL DATA:  Found unconscious. Suspected aspiration. EXAM: PORTABLE CHEST 1 VIEW COMPARISON:  11/16/2018 FINDINGS: The heart size and  mediastinal contours are within normal limits. Pulmonary hyperinflation again seen, consistent with COPD. Both lungs are clear. The visualized skeletal structures are unremarkable. IMPRESSION: COPD.  No active cardiopulmonary disease. Electronically Signed   By: Danae OrleansJohn A Stahl M.D.   On: 01/20/2019 15:46   Ct Maxillofacial Wo Contrast  Result Date: 01/20/2019 CLINICAL DATA:  Pt arrives via [facility] due to being found down at home with altered mental status today by stepdaughter. Per ems, patient lives alone and recently had a welfare check on Friday where he was found normal. Stepdaughter hadn't heard from the patient since Friday. Visited him today and he was found on the floor covered in feces, urine, with ETOH bottles near him. EXAM: CT HEAD WITHOUT CONTRAST CT MAXILLOFACIAL WITHOUT CONTRAST CT CERVICAL SPINE WITHOUT CONTRAST TECHNIQUE: Multidetector CT imaging of the head, cervical spine, and maxillofacial structures were performed using the standard protocol without intravenous contrast. Multiplanar CT image reconstructions of the cervical spine and maxillofacial structures were also generated. COMPARISON:  Head CT, 11/16/2018. FINDINGS: CT HEAD FINDINGS Brain: No evidence of acute infarction, hemorrhage, hydrocephalus, extra-axial collection or mass lesion/mass effect. There is ventricular sulcal enlargement reflecting mild diffuse atrophy, advanced for age. Mild periventricular white matter hypoattenuation is also noted consistent with chronic microvascular ischemic change. Vascular: No hyperdense vessel or unexpected calcification. Skull: Normal. Negative for fracture or focal lesion. Other: None. CT MAXILLOFACIAL FINDINGS Osseous: No fracture or mandibular dislocation. No destructive process. Orbits: Negative. No traumatic or inflammatory finding. Sinuses: Mucosal thickening lines the inferior frontal, bilateral ethmoid, left sphenoid and bilateral maxillary sinuses, greater on the left. Dependent  fluid is seen in the left maxillary sinus. Clear mastoid air cells and middle ear cavities. Soft tissues: Subcutaneous edema noted along the right lateral facial and temporal soft tissues. No mass or defined hematoma. CT CERVICAL SPINE FINDINGS Alignment: Slight reversal the normal cervical lordosis, apex at C5-C6. No subluxation/spondylolisthesis. Skull base and vertebrae: No acute fracture. No primary bone lesion or focal pathologic process. Soft tissues and spinal canal: No prevertebral fluid or swelling. No visible canal hematoma. Disc levels: Minor loss of disc height with mild spondylotic disc bulging at C3-C4. There is significant loss of the disc space height with partial bony fusion at the C5-C6 and C6-C7 levels. Mild to moderate loss of disc height at C7-T1. There are bilateral facet degenerative changes. No convincing disc herniation. Upper chest: No acute findings. Other: None. IMPRESSION: HEAD CT 1. No acute intracranial abnormalities. 2. Atrophy advanced for age. Mild chronic microvascular ischemic change. MAXILLOFACIAL CT 1. No fractures. 2. Mild right-sided facial and temporal region soft tissue edema. 3. Sinus disease as detailed above. CERVICAL CT 1. No fracture or acute finding. Electronically Signed   By: Amie Portlandavid  Ormond M.D.   On: 01/20/2019 16:41    Garnette Gunnerhompson, Rodrecus Belsky B, MD 01/22/2019, 7:39 AM PGY-3, [facility] FPTS Intern pager: [phone], text pages welcome

## 2019-01-22 NOTE — Evaluation (Signed)
Speech Language Pathology Evaluation Patient Details Name: Bryce Thornton MRN: [id number] DOB: Dec 29, 1950 Today's Date: 01/22/2019 Time: 4462-8638 SLP Time Calculation (min) (ACUTE ONLY): 15 min  Problem List:  Patient Active Problem List   Diagnosis Date Noted  . Ketonuria 01/21/2019  . Dehydration 01/21/2019  . Hypoalbuminemia 01/21/2019  . Transaminasemia 01/21/2019  . Multiple abrasions 01/21/2019  . Secondary rhabdomyolysis 01/21/2019  . Leukocytosis 01/21/2019  . Elevated INR 01/21/2019  . Elevated TSH 01/21/2019  . Pansinusitis 01/21/2019  . 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 possible Alcohol Use Disorder, severe 01/21/2019  . Emphysema lung (HCC) 01/21/2019  . Impairment of balance 01/21/2019  . Decreased strength 01/21/2019  . Impaired cognition 01/21/2019  . Moderate protein-calorie malnutrition (HCC) 01/21/2019  . Falls 01/21/2019  . Debility 01/21/2019  . Cognitive safety issue 01/21/2019  . Facial contusion 01/21/2019  . Conjunctivitis 01/21/2019  . Patient taking unknown medications 01/21/2019  . Tobacco dependence 01/21/2019  . Elevated CK   . Hyponatremia   . AMS (altered mental status) 01/20/2019   Past Medical History:  Past Medical History:  Diagnosis Date  . Alcohol abuse   . Balance problem   . Cough    CHRONIC  . Debility 01/21/2019  . Falls 01/21/2019  . Hypertension   . Impaired cognition 01/21/2019  . Impairment of balance 01/21/2019  . Ketonuria 01/21/2019  . 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 Pansinusitis 01/21/2019   01/20/19 Head and Maxillofacial CT finding  . Poor dentition 01/21/2019  . Sinus complaint   . Tobacco dependence 01/21/2019  . Wheezing    Past Surgical History:  Past Surgical History:  Procedure Laterality Date  . CATARACT EXTRACTION W/PHACO Right 08/20/2015   Procedure: CATARACT EXTRACTION PHACO AND INTRAOCULAR LENS PLACEMENT (IOC);   Surgeon: Nevada Crane, MD;  Location: [facility] ORS;  Service: Ophthalmology;  Laterality: Right;  Lot #[id number] [place]: 01:11.0 AP%:18.4 CDE: 13.07  . CATARACT EXTRACTION W/PHACO Left 01/17/2018   Procedure: CATARACT EXTRACTION PHACO AND INTRAOCULAR LENS PLACEMENT (IOC);  Surgeon: Nevada Crane, MD;  Location: [facility] ORS;  Service: Ophthalmology;  Laterality: Left;  [place] 01:22.8 CDE 12.21 Fluid Pack lot # [id number]  . ELBOW SURGERY     TENDON  . TONSILLECTOMY     HPI:  Patient is a 68 year old male with PMH is significant for  HTN, ETOH abuse. Patient's family became concerned after not hearing from patient for 2 days. Patient found down in his home covered in feces and urine. Patient diagnosed with hyponatremia, conjunctivitis, and cellulitis. No acute intracranial abnormalities.   Assessment / Plan / Recommendation Clinical Impression  Pt's cognitive linguistic abilities are difficult to fully assess d/t poor task tolerance. Baseline formal cognitive information is not readily available but per chart review it doesn't appear that pt was thriving. Pt is oriented to location, general time but not situation. Pt's speech is clear and free of any aphasia. As evaluation continued, pt presented with verbal agitation and poor task tolerance. Per pt's nurse, pt is able to communicate wants and needs as well and demonstrate general understanding of POC. At this time, ST will defer further services to next venue of care for optimal effect within more functional setting. Recommend SNF placement as pt doesn't appear able to provide for himself (per information in chart).     SLP Assessment  SLP Recommendation/Assessment: All further Speech Lanaguage Pathology  needs can be addressed in the next venue  of care SLP Visit Diagnosis: Cognitive communication deficit (R41.841)    Follow Up Recommendations  Skilled Nursing facility    Frequency and Duration   N/A        SLP Evaluation Cognition  Overall  Cognitive Status: No family/caregiver present to determine baseline cognitive functioning(See full report) Arousal/Alertness: Awake/alert Orientation Level: Oriented to person;Oriented to place;Oriented to time Behaviors: Restless;Verbal agitation;Poor frustration tolerance       Comprehension  Auditory Comprehension Overall Auditory Comprehension: Appears within functional limits for tasks assessed Visual Recognition/Discrimination Discrimination: Not tested Reading Comprehension Reading Status: Not tested    Expression Expression Primary Mode of Expression: Verbal Verbal Expression Overall Verbal Expression: Appears within functional limits for tasks assessed Written Expression Dominant Hand: Right Written Expression: Not tested   Oral / Motor  Oral Motor/Sensory Function Overall Oral Motor/Sensory Function: Within functional limits Motor Speech Overall Motor Speech: Appears within functional limits for tasks assessed Respiration: Within functional limits Phonation: Normal Resonance: Within functional limits Articulation: Within functional limitis Intelligibility: Intelligible Motor Planning: Witnin functional limits Motor Speech Errors: Not applicable   GO                    Charrisse Masley 01/22/2019, 3:52 PM

## 2019-01-22 NOTE — NC FL2 (Signed)
[place] LEVEL OF CARE SCREENING TOOL     IDENTIFICATION  Patient Name: Bryce Thornton Birthdate: 06/02/1950 Sex: male Admission Date (Current Location): 01/20/2019  [facility] and [place] Number:  [employer] and Address:  [place]. [facility], [place] [address], [place], [place] [address]      Provider Number: [id number]  Attending Physician Name and Address:  McDiarmid, Blane Ohara, MD  Relative Name and Phone Number:  Ovidio Kin - daughter; [phone]; Wife Rasaan Brotherton - [phone]    Current Level of Care: Hospital Recommended Level of Care: [place] Prior Approval Number:    Date Approved/Denied:   PASRR Number: [id number](Eff. 07/07/17)  Discharge Plan: SNF    Current Diagnoses: Patient Active Problem List   Diagnosis Date Noted  . Ketonuria 01/21/2019  . Dehydration 01/21/2019  . Hypoalbuminemia 01/21/2019  . Transaminasemia 01/21/2019  . Multiple abrasions 01/21/2019  . Secondary rhabdomyolysis 01/21/2019  . Leukocytosis 01/21/2019  . Elevated INR 01/21/2019  . Elevated TSH 01/21/2019  . Pansinusitis 01/21/2019  . 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 possible Alcohol Use Disorder, severe 01/21/2019  . Emphysema lung ([place]) 01/21/2019  . Impairment of balance 01/21/2019  . Decreased strength 01/21/2019  . Impaired cognition 01/21/2019  . Moderate protein-calorie malnutrition ([place]) 01/21/2019  . Falls 01/21/2019  . Debility 01/21/2019  . Cognitive safety issue 01/21/2019  . Facial contusion 01/21/2019  . Conjunctivitis 01/21/2019  . Patient taking unknown medications 01/21/2019  . Tobacco dependence 01/21/2019  . Elevated CK   . Hyponatremia   . AMS (altered mental status) 01/20/2019    Orientation RESPIRATION BLADDER Height & Weight     Self, Time, Place  Normal Incontinent, External catheter(catheter placed 11/1) Weight: 175 lb (79.4 kg) Height:  5\' 8"   (172.7 cm)  BEHAVIORAL SYMPTOMS/MOOD NEUROLOGICAL BOWEL NUTRITION STATUS      Continent Diet(Soft diet)  AMBULATORY STATUS COMMUNICATION OF NEEDS Skin   Total Care(Patient was unable to ambulate with PT - unable to negotiate a RW on 01/21/19) Verbally Other (Comment)(See additional comments below)                       Personal Care Assistance Level of Assistance  Bathing, Feeding, Dressing Bathing Assistance: Maximum assistance(Miin assist upper body) Feeding assistance: Limited assistance(Assistance with set-up) Dressing Assistance: Maximum assistance(Min assist upper body)     Functional Limitations Info  Sight, Hearing, Speech Sight Info: Impaired Hearing Info: Adequate Speech Info: Adequate    SPECIAL CARE FACTORS FREQUENCY  PT (By licensed PT), OT (By licensed OT), Speech therapy     PT Frequency: Evaluated at hospital 11/2. PT at [facility] Eval and Treat a minimum of 5 days per week OT Frequency: Evaluated at hospital 11/2. OT at [facility] Eval and Treat a minimum of 5 days per week     Speech Therapy Frequency: Evaluated 11/3      Contractures Contractures Info: Not present    Additional Factors Info  Code Status, Allergies Code Status Info: Full Allergies Info: Dog epithelium           Current Medications (01/22/2019):  This is the current hospital active medication list Current Facility-Administered Medications  Medication Dose Route Frequency Provider Last Rate Last Dose  . Ampicillin-Sulbactam (UNASYN) 3 g in sodium chloride 0.9 % 100 mL IVPB  3 g Intravenous Q6H Brimage, Ronnette Juniper, MD 200 mL/hr at 01/22/19 1306 3 g at 01/22/19 1306  .  bacitracin ointment   Topical BID Myrene Buddy, MD      . enoxaparin (LOVENOX) injection 40 mg  40 mg Subcutaneous Q24H Dana Allan, MD   40 mg at 01/22/19 0520  . erythromycin ophthalmic ointment   Both Eyes Q6H Dana Allan, MD      . feeding supplement (ENSURE ENLIVE) (ENSURE ENLIVE) liquid 237 mL  237 mL Oral BID WC Myrene Buddy, MD      . Melene Muller ON 01/23/2019] fluticasone (FLONASE) 50 MCG/ACT nasal spray 2 spray  2 spray Each Nare Daily McDiarmid, Leighton Roach, MD      . folic acid (FOLVITE) tablet 1 mg  1 mg Oral Daily Dana Allan, MD   1 mg at 01/22/19 1100  . multivitamin with minerals tablet 1 tablet  1 tablet Oral Daily Dana Allan, MD   1 tablet at 01/22/19 1100  . thiamine 500mg  in normal saline (33ml) IVPB  500 mg Intravenous Q8H 45m, MD       And  . Garnette Gunner ON 01/24/2019] thiamine (B-1) 250 mg in sodium chloride 0.9 % 50 mL IVPB  250 mg Intravenous Q24H 13/07/2018, MD         Discharge Medications: Please see discharge summary for a list of discharge medications.  Relevant Imaging Results:  Relevant Lab Results:   Additional Information ss#. Additional information regarding skin: Patient with extensive skin issues as follows: top of left ear a 1 cm x 0.5 cm reddened area with scab. Left and right periorbital redness with crusty discharge from both eyes (left greater than right). Abrasion above left eye. Teeth with decay, cracked and several missing. Left elbow erythema with a 3cm x 3cm and a 4cm x 2 cm unstageable area. Left side of chest with a 5cm x 3 cm stage 2. Left hip/outer thigh: 4cm x 3 cm unstageable with a 1 cm x 2 cm stage 2 in center,a 2 cm x 1 cm unstageable at 0500, a 1 cm x 1 cm to the left of the larger unstageable, a 3 cm x 2 cm unstageable. Left side with erythema 10cm x 5 cm with a 2 cm x 1 cm unstageable and a 2 cm x 1 cm stage 2 .Left side of outer thigh, 8cm x 6 cm unstageable with a mixture of black area and redness. Left knee Stage 2 with abrasion and bruising. Area of redness on outer aspect of calf, but cool to touch and without pain. Nailbeds with feces and thick fungus appearance. 2nd and 3rd toes on both feet are webbed. Bilateral heels red, nonblancheable. Scrotum red,excoriated,swollen with 2 cm x 2 cm Stage 2 on the underside. MASD to groin,scrotum  with a yeasty smell and discharge. Right knee with abrasions and bruising and appears differently shaped than right. Healed scar to side of knee. Abrasion to the back of and inner thigh measuring 14 cm x 5 cm. Right hip with black 2 cm x 3 cm unstageable. Right scapula redness. Right wrist and hand with bruising and a 1 cm x 0.5 cm fluid filled blister. Right forearm 1 cm x 1 cm stage 2. Areas cleaned, Mepilex, Barrier cream and Miconazole powder applied to areas.  03-04-1988, LCSW

## 2019-01-23 ENCOUNTER — Encounter (HOSPITAL_COMMUNITY): Payer: Self-pay | Admitting: Family Medicine

## 2019-01-23 DIAGNOSIS — F10931 Alcohol use, unspecified with withdrawal delirium: Secondary | ICD-10-CM

## 2019-01-23 DIAGNOSIS — A4159 Other Gram-negative sepsis: Secondary | ICD-10-CM

## 2019-01-23 DIAGNOSIS — L8990 Pressure ulcer of unspecified site, unspecified stage: Secondary | ICD-10-CM | POA: Insufficient documentation

## 2019-01-23 DIAGNOSIS — W19XXXD Unspecified fall, subsequent encounter: Secondary | ICD-10-CM

## 2019-01-23 DIAGNOSIS — F10231 Alcohol dependence with withdrawal delirium: Secondary | ICD-10-CM

## 2019-01-23 HISTORY — DX: Alcohol use, unspecified with withdrawal delirium: F10.931

## 2019-01-23 HISTORY — DX: Alcohol dependence with withdrawal delirium: F10.231

## 2019-01-23 LAB — CBC
HCT: 36.9 % — ABNORMAL LOW (ref 39.0–52.0)
Hemoglobin: 12.1 g/dL — ABNORMAL LOW (ref 13.0–17.0)
MCH: 31.5 pg (ref 26.0–34.0)
MCHC: 32.8 g/dL (ref 30.0–36.0)
MCV: 96.1 fL (ref 80.0–100.0)
Platelets: 219 10*3/uL (ref 150–400)
RBC: 3.84 MIL/uL — ABNORMAL LOW (ref 4.22–5.81)
RDW: 14.9 % (ref 11.5–15.5)
WBC: 7.5 10*3/uL (ref 4.0–10.5)
nRBC: 0 % (ref 0.0–0.2)

## 2019-01-23 LAB — BASIC METABOLIC PANEL
Anion gap: 10 (ref 5–15)
BUN: 7 mg/dL — ABNORMAL LOW (ref 8–23)
CO2: 27 mmol/L (ref 22–32)
Calcium: 8.4 mg/dL — ABNORMAL LOW (ref 8.9–10.3)
Chloride: 99 mmol/L (ref 98–111)
Creatinine, Ser: 0.69 mg/dL (ref 0.61–1.24)
GFR calc Af Amer: >60 mL/min (ref >60)
GFR calc non Af Amer: >60 mL/min (ref >60)
Glucose, Bld: 101 mg/dL — ABNORMAL HIGH (ref 70–99)
Potassium: 3.6 mmol/L (ref 3.5–5.1)
Sodium: 136 mmol/L (ref 135–145)

## 2019-01-23 LAB — CULTURE, BLOOD (SINGLE)

## 2019-01-23 MED ORDER — COLLAGENASE 250 UNIT/GM EX OINT
TOPICAL_OINTMENT | Freq: Every day | CUTANEOUS | Status: DC
  Administered 2019-01-23 – 2019-01-25 (×3): via TOPICAL
  Filled 2019-01-23: qty 30

## 2019-01-23 MED ORDER — SULFAMETHOXAZOLE-TRIMETHOPRIM 800-160 MG PO TABS
1.0000 | ORAL_TABLET | Freq: Two times a day (BID) | ORAL | Status: DC
  Administered 2019-01-23 – 2019-01-25 (×5): 1 via ORAL
  Filled 2019-01-23 (×5): qty 1

## 2019-01-23 MED ORDER — NICOTINE 14 MG/24HR TD PT24
14.0000 mg | MEDICATED_PATCH | Freq: Every day | TRANSDERMAL | Status: DC
  Administered 2019-01-23 – 2019-01-25 (×3): 14 mg via TRANSDERMAL
  Filled 2019-01-23 (×3): qty 1

## 2019-01-23 NOTE — Consult Note (Addendum)
[facility] Nurse wound consult note  Reason for Consult: Pt was found down at home for an unknown period of time, and was incontinent, according to progress notes.  There are multiple pressure injuries which are necrotic. Wound type: Pressure Injury POA: Yes Measurement: Left flank unstageable 4X7cm, 80% red and moist, surrounding 20% eschar; 3X3cm in the center. Left hip unstageable upper area; 10X5cm, 80% red, 20% eschar Left hip unstageable lower area; 8X6cm 100% tightly adhered eschar Left elbow with unstageable pressure injuries in patchy areas; location is approx 3X5cm; 80% red, 20% slough Left heel with deep tissue pressure injury; intact dark purple blister Right hip with deep tissue pressure injury; 3X3cm dark red-purple Dressing procedure/placement/frequency: Santyl ointment to provide enzymatic debridement of nonviable tissue to left flank, left hip, left elbow wounds.  Foam dressing to protect left heel and right hip from further injury.  Float heels to reduce pressure. Pt could benefit from follow-up at the outpatient wound care center after discharge since these wounds may require eventual sharp debridement as they soften and evolve with the use of Santyl; please order if desired. Please re-consult if further assistance is needed.  Thank-you,  Julien Girt MSN, [place], [place], [place], [place]

## 2019-01-23 NOTE — Progress Notes (Signed)
Family Medicine Teaching Service Daily Progress Note Intern Pager: [phone]  Patient name: Bryce Thornton Medical record number: [id number] Date of birth: [birth year] Age: 68 y.o. Gender: male  Primary Care Provider: Driggers, Hewitt Shorts, PA-C Consultants: ID Code Status: Full Emergency Contact: step-daughter Rica Mote [phone]  Pt Overview and Major Events to Date:  11/1 - Admitted with AMS  Assessment and Plan: Praneeth Bussey Roudabushis a 68 y.o.malepresenting with altered mental status. PMH is significant for HTN, and ETOH abuse.  Altered Mental Status, improved.  No acute events ON, CIWAs remain 0. The patient appears to be back to his baseline mentation which continues to have deficits possibly attributed to chronic alcohol use. PT recommending SNF placement. Speech attempted [facility] with patient yesterday 11/3 which he was unable to complete. Patient states that he has no one at home to take care of him, and that he is willing to go to SNF if required. -Vital signs per unit -Neurochecksevery 4 -stop CIWA protocol - last several readings have been 0 -PT/OT - recommending SNF/24 hr supervision -SW for SNF placement -IV thiamine 500 mg q8h for 48 hrs, then decrease to 250 mg q24h for 5 days  Acinetobacter baumannii bacteremia Placed on IV Unasyn per ID pharmacist. Susceptibilities showing Bactrim as an option.  Leukocytosis resolved. - Per ID can start double strength Bactrim x 7 days (total of 10 day Antibiotic treatment) - Stop IV Unasyn per ID - ID has signed off  Skin lacerations with surrounding erythema, stable, healing [place] covered with foam island dressings and bacitracin, healing well, no further sign of infection. - Continue foam islands with bacitracin, especially to left elbow, left outer thigh and R inner thigh - Stop IV Unasyn, start Bactrim PO x 7 days  Hyponatremia, resolved 129 > 136 on 11/4. -monitor  Hypokalemia, resolved Mild 3.3>3.6 today 11/4.  Repleted 11/3. - monitor w/ BMP  Conjunctivitis, improved Eyes continue to improve, no drainage appreciated today.   -Erythromycin ointment both eyes every 6 hours -Warm compresses as needed  FEN/GI: soft diet, patient has poor dentition PPx: Lovenox 40mg  daily  Disposition: PATIENT IS MEDICALLY CLEARED FOR SNF  Subjective:  Patient seen this morning sitting upright in bed, no concerns or complaints this morning. Is much more coherent and interactive today.   Objective: Temp:  [98 F (36.7 C)-98.4 F (36.9 C)] 98 F (36.7 C) (11/04 0500) Pulse Rate:  [81-93] 88 (11/04 0500) Resp:  [17-18] 18 (11/04 0500) BP: (108-132)/(68-79) 132/68 (11/04 0500) SpO2:  [99 %-100 %] 99 % (11/04 0500) Weight:  [79 kg] 79 kg (11/03 2131)  Physical Exam: General: no apparent distress, nontoxic appearing Cardiovascular: RRR, S1S2 present, no murmurs appreciated Respiratory: CTA bilaterally, moving air well Abdomen: Soft, normal bowel sounds Extremities: no swelling or edema; wounds to upper and lower extremities are covered with foam island bandages  Laboratory: Recent Labs  Lab 01/21/19 0355 01/22/19 0932 01/23/19 0429  WBC 13.1* 7.4 7.5  HGB 14.0 12.5* 12.1*  HCT 41.8  42.1 38.0* 36.9*  PLT 192 204 219   Recent Labs  Lab 01/20/19 1550 01/21/19 0355 01/22/19 0932 01/23/19 0429  NA 129* 133* 134* 136  K 3.7 3.7 3.3* 3.6  CL 92* 92* 97* 99  CO2 17* 22 22 27   BUN 14 11 6* 7*  CREATININE 1.07 0.93 0.84 0.69  CALCIUM 9.1 8.7* 8.4* 8.4*  PROT 6.2* 5.5*  --   --   BILITOT 2.5* 2.0*  --   --   [facility]  115 94  --   --   ALT 32 27  --   --   AST 75* 50*  --   --   GLUCOSE 98 77 70 101*    Methylmalonic acid: pending Folate: wnl Lactic acid: 1.8 Vitamin B1: pending  Imaging/Diagnostic Tests: Mr Brain Wo Contrast Result Date: 01/22/2019 CLINICAL DATA:  Ataxia, stroke suspected. Encephalopathy. EXAM: MRI HEAD WITHOUT CONTRAST TECHNIQUE: Multiplanar, multiecho pulse sequences  of the brain and surrounding structures were obtained without intravenous contrast. COMPARISON:  Head CT 01/20/2019 and MRI 11/19/2018 FINDINGS: Some sequences are mildly to moderately motion degraded. Brain: There is no evidence of acute infarct, intracranial hemorrhage, mass, midline shift, or extra-axial fluid collection. There is moderately advanced cerebral atrophy. Periventricular white matter T2 hyperintensities are unchanged from the prior MRI and nonspecific but compatible with mild chronic small vessel ischemic disease. Diffusion and T2/FLAIR signal abnormality in the mesial thalami on the prior MRI has resolved. A small cavum septum pellucidum et vergae is incidentally noted. Vascular: Major intracranial vascular flow voids are preserved. Skull and upper cervical spine: Unremarkable bone marrow signal. Sinuses/Orbits: Bilateral cataract extraction. Mild-to-moderate mucosal thickening in the paranasal sinuses. Small volume fluid in the left maxillary sinus. Small to moderate right and small left mastoid effusions. Other: None. IMPRESSION: 1. No acute intracranial abnormality. 2. Interval resolution of bithalamic signal abnormality on the prior MRI. 3. Moderately advanced cerebral atrophy and mild chronic small vessel ischemic disease. Electronically Signed   By: Sebastian Ache M.D.   On: 01/22/2019 21:37   Dg Knee Complete 4 Views Left Result Date: 01/22/2019 CLINICAL DATA:  Recent fall with left knee pain, initial encounter EXAM: LEFT KNEE - COMPLETE 4+ VIEW COMPARISON:  None. FINDINGS: No evidence of fracture, dislocation, or joint effusion. No evidence of arthropathy or other focal bone abnormality. Soft tissues are unremarkable. IMPRESSION: No acute abnormality noted. Electronically Signed   By: Alcide Clever M.D.   On: 01/22/2019 10:32   Dg Abd Portable 1v Result Date: 01/22/2019 CLINICAL DATA:  Altered mental status. MRI clearance. Rule out metallic foreign body. EXAM: PORTABLE ABDOMEN - 1 VIEW  COMPARISON:  None. FINDINGS: The bowel gas pattern is normal. No metallic foreign bodies identified. Iliac vascular calcification and thoracolumbar dextroscoliosis are seen. IMPRESSION: No metallic foreign body or other acute findings. Electronically Signed   By: Danae Orleans M.D.   On: 01/22/2019 20:27    Dollene Cleveland, DO 01/23/2019, 8:38 AM PGY-2, [place] [facility] FPTS Intern pager: [phone], text pages welcome

## 2019-01-23 NOTE — Care Management Important Message (Signed)
Important Message  Patient Details  Name: Bryce Thornton MRN: [id number] Date of Birth: Jul 31, 1950   Medicare Important Message Given:  Yes     [name] 01/23/2019, 1:56 PM

## 2019-01-23 NOTE — Progress Notes (Signed)
[facility]   Reason for visit: Follow up on bacteremia  Interval History: repeat blood cultures with ngtd.  Sensitivities noted and only ceftriaxone intermediate, otherwise sensitive.  No fever, WBC now wnl.  No new complaints.  No associated diarrhea.    Physical Exam: Constitutional:  Vitals:   01/23/19 0500 01/23/19 0846  BP: 132/68 130/78  Pulse: 88 93  Resp: 18 16  Temp: 98 F (36.7 C) 97.8 F (36.6 C)  SpO2: 99% 98%   patient appears in NAD Respiratory: Normal respiratory effort; CTA B Cardiovascular: RRR GI: soft, nt, nd MS: left leg lesion dry, some surrounding erythema but minimal warmth.    Review of Systems: Constitutional: negative for fevers and chills Gastrointestinal: negative for nausea and diarrhea Integument/breast: negative for rash Musculoskeletal: negative for myalgias and arthralgias  Lab Results  Component Value Date   WBC 7.5 01/23/2019   HGB 12.1 (L) 01/23/2019   HCT 36.9 (L) 01/23/2019   MCV 96.1 01/23/2019   PLT 219 01/23/2019    Lab Results  Component Value Date   CREATININE 0.69 01/23/2019   BUN 7 (L) 01/23/2019   NA 136 01/23/2019   K 3.6 01/23/2019   CL 99 01/23/2019   CO2 27 01/23/2019    Lab Results  Component Value Date   ALT 27 01/21/2019   AST 50 (H) 01/21/2019   ALKPHOS 94 01/21/2019     Microbiology: Recent Results (from the past 240 hour(s))  SARS CORONAVIRUS 2 (TAT 6-24 HRS) Nasopharyngeal Nasopharyngeal Swab     Status: None   Collection Time: 01/20/19  6:53 PM   Specimen: Nasopharyngeal Swab  Result Value Ref Range Status   SARS Coronavirus 2 NEGATIVE NEGATIVE Final    Comment: (NOTE) SARS-CoV-2 target nucleic acids are NOT DETECTED. The SARS-CoV-2 RNA is generally detectable in upper and lower respiratory specimens during the acute phase of infection. Negative results do not preclude SARS-CoV-2 infection, do not rule out co-infections with other pathogens, and should not be  used as the sole basis for treatment or other patient management decisions. Negative results must be combined with clinical observations, patient history, and epidemiological information. The expected result is Negative. Fact Sheet for Patients: HairSlick.nohttps://www.fda.gov/media/138098/download Fact Sheet for Healthcare Providers: quierodirigir.comhttps://www.fda.gov/media/138095/download This test is not yet approved or cleared by the [place] FDA and  has been authorized for detection and/or diagnosis of SARS-CoV-2 by FDA under an Emergency Use Authorization (EUA). This EUA will remain  in effect (meaning this test can be used) for the duration of the COVID-19 declaration under Section 56 4(b)(1) of the Act, 21 U.S.C. section 360bbb-3(b)(1), unless the authorization is terminated or revoked sooner. Performed at [facility], [address] [place], [place] [address]   Culture, blood (single)     Status: Abnormal   Collection Time: 01/20/19 11:57 PM   Specimen: BLOOD  Result Value Ref Range Status   Specimen Description BLOOD LEFT ANTECUBITAL  Final   Special Requests   Final    BOTTLES DRAWN AEROBIC ONLY Blood Culture results may not be optimal due to an inadequate volume of blood received in culture bottles   Culture  Setup Time   Final    GRAM NEGATIVE COCCOBACILLI AEROBIC BOTTLE ONLY CRITICAL RESULT CALLED TO, READ BACK BY AND VERIFIED WITH: J. FRENS, PHARMD AT 1510 ON 01/21/19 BY C. JESSUP, MT. Performed at [facility], [address] [place], [place] [address]    Culture ACINETOBACTER CALCOACETICUS/BAUMANNII  COMPLEX (A)  Final   Report Status 01/23/2019 FINAL  Final   Organism ID, Bacteria ACINETOBACTER CALCOACETICUS/BAUMANNII COMPLEX  Final      Susceptibility   Acinetobacter calcoaceticus/baumannii complex - MIC*    CEFTAZIDIME 4 SENSITIVE Sensitive     CEFTRIAXONE 16 INTERMEDIATE Intermediate     CIPROFLOXACIN <=0.25 SENSITIVE Sensitive     GENTAMICIN <=1 SENSITIVE  Sensitive     IMIPENEM <=0.25 SENSITIVE Sensitive     PIP/TAZO 8 SENSITIVE Sensitive     TRIMETH/SULFA <=20 SENSITIVE Sensitive     CEFEPIME 2 SENSITIVE Sensitive     AMPICILLIN/SULBACTAM <=2 SENSITIVE Sensitive     * ACINETOBACTER CALCOACETICUS/BAUMANNII COMPLEX  Blood Culture ID Panel (Reflexed)     Status: Abnormal   Collection Time: 01/20/19 11:57 PM  Result Value Ref Range Status   Enterococcus species NOT DETECTED NOT DETECTED Final   Listeria monocytogenes NOT DETECTED NOT DETECTED Final   Staphylococcus species NOT DETECTED NOT DETECTED Final   Staphylococcus aureus (BCID) NOT DETECTED NOT DETECTED Final   Streptococcus species NOT DETECTED NOT DETECTED Final   Streptococcus agalactiae NOT DETECTED NOT DETECTED Final   Streptococcus pneumoniae NOT DETECTED NOT DETECTED Final   Streptococcus pyogenes NOT DETECTED NOT DETECTED Final   Acinetobacter baumannii DETECTED (A) NOT DETECTED Final    Comment: CRITICAL RESULT CALLED TO, READ BACK BY AND VERIFIED WITH: J. FRENS, PHARMD AT 1510 ON 01/21/19 BY C. JESSUP, MT.    Enterobacteriaceae species NOT DETECTED NOT DETECTED Final   Enterobacter cloacae complex NOT DETECTED NOT DETECTED Final   Escherichia coli NOT DETECTED NOT DETECTED Final   Klebsiella oxytoca NOT DETECTED NOT DETECTED Final   Klebsiella pneumoniae NOT DETECTED NOT DETECTED Final   Proteus species NOT DETECTED NOT DETECTED Final   Serratia marcescens NOT DETECTED NOT DETECTED Final   Carbapenem resistance NOT DETECTED NOT DETECTED Final   Haemophilus influenzae NOT DETECTED NOT DETECTED Final   Neisseria meningitidis NOT DETECTED NOT DETECTED Final   Pseudomonas aeruginosa NOT DETECTED NOT DETECTED Final   Candida albicans NOT DETECTED NOT DETECTED Final   Candida glabrata NOT DETECTED NOT DETECTED Final   Candida krusei NOT DETECTED NOT DETECTED Final   Candida parapsilosis NOT DETECTED NOT DETECTED Final   Candida tropicalis NOT DETECTED NOT DETECTED Final     Comment: Performed at [facility], [address], [place], [place] [address]  Culture, blood (routine x 2)     Status: None (Preliminary result)   Collection Time: 01/21/19  4:21 PM   Specimen: BLOOD RIGHT ARM  Result Value Ref Range Status   Specimen Description BLOOD RIGHT ARM  Final   Special Requests   Final    BOTTLES DRAWN AEROBIC ONLY Blood Culture adequate volume   Culture   Final    NO GROWTH < 24 HOURS Performed at [facility], [address] [place], [place] [address]    Report Status PENDING  Incomplete  Culture, blood (routine x 2)     Status: None (Preliminary result)   Collection Time: 01/21/19  4:21 PM   Specimen: BLOOD RIGHT HAND  Result Value Ref Range Status   Specimen Description BLOOD RIGHT HAND  Final   Special Requests   Final    BOTTLES DRAWN AEROBIC ONLY Blood Culture results may not be optimal due to an inadequate volume of blood received in culture bottles   Culture   Final    NO GROWTH < 24 HOURS Performed at [facility], [place] [address] [place], [place] [address]    Report Status PENDING  Incomplete    Impression/Plan:  1. Bacteremia - transient vs infection from wounds.  Repeat blood cultures ngtd.  I though feel this should be treated and fortunately is sensitive so 7 more days of Bactrim DS twice a day will be adequate.    2. Multiple wounds - continue wound care, some improvement   Thanks for consultation and I will sign off

## 2019-01-23 NOTE — Clinical Social Work Note (Signed)
Patient in need of ST rehab and talked with daughter earlier today regarding bed offer from [facility] in [place]. She expressed agreement with this facility. Call made to Margarita Grizzle (11:33 am) - care transition nurse and she is out of the office. Call made to [place] - care transition nurse and message left (12:45 pm). Received VM from [place] later today and they can take patient tomorrow afternoon or Friday. She indicated that Margarita Grizzle would be back tomorrow and that CSW contact her regarding patient. CSW will follow-up with Margarita Grizzle, care transitions nurse with [facility] on Thursday.  Hansel Devan Givens, MSW, LCSW Licensed Clinical Social Worker [place] [phone]

## 2019-01-24 DIAGNOSIS — E512 Wernicke's encephalopathy: Principal | ICD-10-CM

## 2019-01-24 LAB — SARS CORONAVIRUS 2 (TAT 6-24 HRS): SARS Coronavirus 2: NEGATIVE

## 2019-01-24 MED ORDER — HALOPERIDOL LACTATE 5 MG/ML IJ SOLN
1.0000 mg | Freq: Once | INTRAMUSCULAR | Status: DC
  Filled 2019-01-24: qty 1

## 2019-01-24 MED ORDER — VITAMIN B-1 100 MG PO TABS
100.0000 mg | ORAL_TABLET | Freq: Three times a day (TID) | ORAL | Status: DC
  Administered 2019-01-24 – 2019-01-25 (×3): 100 mg via ORAL
  Filled 2019-01-24 (×3): qty 1

## 2019-01-24 NOTE — Progress Notes (Signed)
Physical Therapy Treatment Patient Details Name: Bryce Thornton MRN: [id number] DOB: 12-02-1950 Today's Date: 01/24/2019    History of Present Illness Patient is a 68 year old male with PMH is significant for  HTN, ETOH abuse. Patient's family became concerned after not hearing from patient for 2 days. Patient found down in his home covered in feces and urine. Patient diagnosed with hyponatremia, conjunctivitis, and cellulitis.     PT Comments    Pt progressing towards physical therapy goals. Appeared to be attempting to be disagreeable on purpose at times, and was difficult to get a true sense of cognition as pt appeared confused at times as well. Pt did well getting to EOB and standing to walker, however unable to advance feet to take steps. Pt appeared to get overwhelmed with the equipment in the room and sat back down, stating there was "too many moving parts in front of" him to attempt ambulation to chair. After x3 attempts, we used the Stedy to transition to recliner. Will continue to follow and progress as able per POC.    Follow Up Recommendations  SNF;Supervision/Assistance - 24 hour     Equipment Recommendations  None recommended by PT    Recommendations for Other Services       Precautions / Restrictions Precautions Precautions: Fall Restrictions Weight Bearing Restrictions: No    Mobility  Bed Mobility Overal bed mobility: Needs Assistance Bed Mobility: Supine to Sit     Supine to sit: Supervision     General bed mobility comments: Pt was able to transition to EOB without assistance x2 - use of bed rails required but able to complete without therapist assist. Upon sitting EOB, pt with posterior lean and having difficulty getting feet on floor.   Transfers Overall transfer level: Needs assistance Equipment used: Rolling walker (2 wheeled);2 person hand held assist Transfers: Sit to/from Stand Sit to Stand: Min guard         General transfer comment: Pt  was able to complete x3 trials of sit<>stand EOB. Each time pt did not require assistance (very adamant that therapist not touch him or pull on gait belt). With each trial, pt   Ambulation/Gait             General Gait Details: Unable   Stairs             Wheelchair Mobility    Modified Rankin (Stroke Patients Only)       Balance Overall balance assessment: Needs assistance Sitting-balance support: Feet supported;Bilateral upper extremity supported Sitting balance-Leahy Scale: Poor     Standing balance support: Bilateral upper extremity supported Standing balance-Leahy Scale: Poor Standing balance comment: verbal cues for safety and hand placement                            Cognition Arousal/Alertness: Awake/alert Behavior During Therapy: WFL for tasks assessed/performed Overall Cognitive Status: No family/caregiver present to determine baseline cognitive functioning                                 General Comments: Pt appears confused at times, not initially agreeable to work with therapy, but participated as therapist guided him through activity.       Exercises      General Comments        Pertinent Vitals/Pain Pain Assessment: Faces Faces Pain Scale: No hurt Pain Intervention(s): Monitored during  session    Home Living                      Prior Function            PT Goals (current goals can now be found in the care plan section) Acute Rehab PT Goals Patient Stated Goal: No goals stated this session.  PT Goal Formulation: With patient Time For Goal Achievement: 02/04/19 Potential to Achieve Goals: Good Progress towards PT goals: Progressing toward goals    Frequency    Min 2X/week      PT Plan Current plan remains appropriate    Co-evaluation              AM-PAC PT "6 Clicks" Mobility   Outcome Measure  Help needed turning from your back to your side while in a flat bed without using  bedrails?: A Little Help needed moving from lying on your back to sitting on the side of a flat bed without using bedrails?: A Lot Help needed moving to and from a bed to a chair (including a wheelchair)?: A Lot Help needed standing up from a chair using your arms (e.g., wheelchair or bedside chair)?: A Lot Help needed to walk in hospital room?: Total Help needed climbing 3-5 steps with a railing? : Total 6 Click Score: 11    End of Session Equipment Utilized During Treatment: Gait belt Activity Tolerance: Patient limited by fatigue Patient left: in chair;with call bell/phone within reach;with chair alarm set Nurse Communication: Mobility status;Need for lift equipment(Stedy vs. SPT for back to bed) PT Visit Diagnosis: Unsteadiness on feet (R26.81);Muscle weakness (generalized) (M62.81);History of falling (Z91.81);Pain     Time: 7062-3762 PT Time Calculation (min) (ACUTE ONLY): 22 min  Charges:  $Gait Training: 8-22 mins                     Conni Slipper, PT, DPT Acute Rehabilitation Services Pager: [phone] Office: [phone]    Marylynn Pearson 01/24/2019, 1:30 PM

## 2019-01-24 NOTE — TOC Progression Note (Addendum)
Transition of Care [facility]) - Progression Note    Patient Details  Name: Bryce Thornton MRN: [id number] Date of Birth: Jan 09, 1951  Transition of [facility]) CM/SW Contact  Sharlet Salina Mila Homer, LCSW Phone Number: 01/24/2019, 2:52 PM  Clinical Narrative:  Talked with Margarita Grizzle, admissions liaison for [facility] and they can accept patient on Friday once auth received and COVID results are in. Clinicals transmitted to [place] for [facility] Medicare and COVID test requested. Daughter Ovidio Kin contacted (2:47 pm) and updated regarding facility that can accept her dad and she is in agreement.   2:53 pm - Received call from Caren Griffins with [employer] and clarified facility that patient will discharge to. CSW will be contacted once auth decision made.   3:07 pm - Talked with MD and update on SNF placement provided. Requested COVID test.  Facility will have a bed for patient on Friday and [name] can discharge once insurance auth received and COVID test results.   Expected Discharge Plan: Skilled Nursing Facility Barriers to Discharge: Ship broker, Continued Medical Work up, SNF Pending bed offer  Expected Discharge Plan and Services Expected Discharge Plan: [place] In-house Referral: Clinical Social Work     Living arrangements for the past 2 months: Single Family Home                                     Social Determinants of Health (SDOH) Interventions  No SDOH interventions needed at this time.  Readmission Risk Interventions No flowsheet data found.

## 2019-01-24 NOTE — Progress Notes (Signed)
Family Medicine Teaching Service Daily Progress Note Intern Pager: [phone]  Patient name: Bryce Thornton Medical record number: [id number] Date of birth: April 26, 1950 Age: 68 y.o. Gender: male  Primary Care Provider: Driggers, Gerome Apley, PA-C Consultants: CONSULT FOR UNASSIGNED MEDICAL ADMISSION CONSULT TO SOCIAL WORK CONSULT TO WOUND, OSTOMY, CONTINENCE Code Status: Full Code   Pt Overview and Major Events to Date:  Hospital Day: 5 01/20/2019: admitted for Weakness and Altered Mental Status   Assessment and Plan: Ilhan Madan Roudabushis a 68 y.o.malepresenting with altered mental status. PMH is significant for HTN, and ETOH abuse.  Altered Mental Status, improved Patient alert and oriented to self, year, president.  He is not oriented to location.  He believes that he has at home.  When talking to him about discharge to nursing facility, he is not very excited but is okay with continuing discussed with family.  Called daughter this morning and she reports that she would like him to go to a skilled nursing facility as she is uncomfortable with him going home alone.  Attempted [facility] on 11/3.  PT OT [place] for SNF  Patient ready for discharge to SNF today  Discontinue catheter   Acinetobacter baumanniibacteremia Originally on IV Unasyn and then transition to Bactrim.  We will continue Bactrim x7 days twice daily.  Status post IV Unasyn x3 days (11/2-11/4) continue Bactrim (11/4/ to 11/10)   ID signed off   Skin lacerations with surrounding erythema, stable, healing  Continue foam islands with bacitracin, especially to left elbow, left outer thigh and R inner thigh Hypokalemia, resolved Mild 3.3>3.6 today 11/4. Repleted 11/3. - monitor w/ BMP  Hyponatremia, stable.  Na 136 yesterday. No labs this morning.   Conjunctivitis, improved No drainage or erythema this morning.   Erythromycin ointment both eyes every 6 hours until improved.   Can discharge with ointment    FEN/GI: soft diet, patient has poor dentition PPx: Lovenox 40mg  daily  Access: External cath 01/20/19. PIV left upper arm.   Subjective:  NAEO.   Objective: Temp:  [97.8 F (36.6 C)-98.5 F (36.9 C)] 98 F (36.7 C) (11/05 0353) Pulse Rate:  [87-93] 90 (11/05 0353) Resp:  [16-18] 18 (11/05 0353) BP: (130-145)/(76-100) 137/78 (11/05 0353) SpO2:  [96 %-100 %] 100 % (11/05 0353) Intake/Output      11/04 0701 - 11/05 0700   P.O. 840   Total Intake(mL/kg) 840 (10.6)   Urine (mL/kg/hr) 1400 (0.7)   Emesis/NG output 0   Other 0   Stool 0   Blood 0   Total Output 1400   Net -560       Urine Occurrence 4 x   Stool Occurrence 0 x   Emesis Occurrence 0 x       Physical Exam: General: NAD, non-toxic, well-appearing, sitting comfortably in bed, watching tv, eating breakfast. Using right hand.    HEENT: [place]/AT. PERRLA. EOMI.  Cardiovascular: RRR, normal S1, S2. B/L 2+ RP. No BLEE Respiratory: CTAB. No IWOB.  Abdomen: + BS. NT, ND, soft to palpation.  Extremities: Warm and well perfused. Moving spontaneously.  Integumentary: No obvious rashes, lesions, trauma on general exam. Neuro: A & O to self, year, president. Reports that he is at home. CN grossly intact. No FND  Laboratory: I have personally read and reviewed all labs and imaging studies.  CBC: Recent Labs  Lab 01/20/19 1550 01/21/19 0355 01/22/19 0932 01/23/19 0429  WBC 17.4* 13.1* 7.4 7.5  NEUTROABS 13.2*  --   --   --  HGB 16.2 14.0 12.5* 12.1*  HCT 47.9 41.8  42.1 38.0* 36.9*  MCV 94.9 94.8 95.7 96.1  PLT 189 192 204 219   CMP: Recent Labs  Lab 01/20/19 1550 01/21/19 0355 01/22/19 0932 01/23/19 0429  NA 129* 133* 134* 136  K 3.7 3.7 3.3* 3.6  CL 92* 92* 97* 99  CO2 17* 22 22 27   GLUCOSE 98 77 70 101*  BUN 14 11 6* 7*  CREATININE 1.07 0.93 0.84 0.69  CALCIUM 9.1 8.7* 8.4* 8.4*  ALBUMIN 2.8* 2.5*  --   --    CBG: Recent Labs  Lab 01/20/19 1440  GLUCAP 105*   Micro: Covid Negative    Imaging/Diagnostic Tests: Mr Brain Wo Contrast  Result Date: 01/22/2019 CLINICAL DATA:  Ataxia, stroke suspected. Encephalopathy. EXAM: MRI HEAD WITHOUT CONTRAST TECHNIQUE: Multiplanar, multiecho pulse sequences of the brain and surrounding structures were obtained without intravenous contrast. COMPARISON:  Head CT 01/20/2019 and MRI 11/19/2018 FINDINGS: Some sequences are mildly to moderately motion degraded. Brain: There is no evidence of acute infarct, intracranial hemorrhage, mass, midline shift, or extra-axial fluid collection. There is moderately advanced cerebral atrophy. Periventricular white matter T2 hyperintensities are unchanged from the prior MRI and nonspecific but compatible with mild chronic small vessel ischemic disease. Diffusion and T2/FLAIR signal abnormality in the mesial thalami on the prior MRI has resolved. A small cavum septum pellucidum et vergae is incidentally noted. Vascular: Major intracranial vascular flow voids are preserved. Skull and upper cervical spine: Unremarkable bone marrow signal. Sinuses/Orbits: Bilateral cataract extraction. Mild-to-moderate mucosal thickening in the paranasal sinuses. Small volume fluid in the left maxillary sinus. Small to moderate right and small left mastoid effusions. Other: None. IMPRESSION: 1. No acute intracranial abnormality. 2. Interval resolution of bithalamic signal abnormality on the prior MRI. 3. Moderately advanced cerebral atrophy and mild chronic small vessel ischemic disease. Electronically Signed   By: Logan Bores M.D.   On: 01/22/2019 21:37   Dg Knee Complete 4 Views Left  Result Date: 01/22/2019 CLINICAL DATA:  Recent fall with left knee pain, initial encounter EXAM: LEFT KNEE - COMPLETE 4+ VIEW COMPARISON:  None. FINDINGS: No evidence of fracture, dislocation, or joint effusion. No evidence of arthropathy or other focal bone abnormality. Soft tissues are unremarkable. IMPRESSION: No acute abnormality noted. Electronically  Signed   By: Inez Catalina M.D.   On: 01/22/2019 10:32   Dg Abd Portable 1v  Result Date: 01/22/2019 CLINICAL DATA:  Altered mental status. MRI clearance. Rule out metallic foreign body. EXAM: PORTABLE ABDOMEN - 1 VIEW COMPARISON:  None. FINDINGS: The bowel gas pattern is normal. No metallic foreign bodies identified. Iliac vascular calcification and thoracolumbar dextroscoliosis are seen. IMPRESSION: No metallic foreign body or other acute findings. Electronically Signed   By: Marlaine Hind M.D.   On: 01/22/2019 20:27    EKG Interpretation  Date/Time:  Sunday January 20 2019 16:57:03 EST Ventricular Rate:  92 PR Interval:    QRS Duration: 93 QT Interval:  376 QTC Calculation: 466 R Axis:   -45 Text Interpretation: Sinus rhythm Probable left atrial enlargement Left anterior fascicular block Abnormal R-wave progression, late transition Confirmed by Virgel Manifold [phone]) on 01/22/2019 8:01:14 AM       Wilber Oliphant, MD 01/24/2019, 6:37 AM PGY-2, [place] Intern pager: [phone], text pages welcome

## 2019-01-24 NOTE — Progress Notes (Signed)
Patient pulled out his peripheral IV, notified MD, was okay with not having another peripheral IV as he might be getting discharged depending upon placement.

## 2019-01-24 NOTE — Clinical Social Work Note (Signed)
Received a call from Lovena Le with [employer] (4:23 pm) regarding insurance authorization for patient: Alen Bleacher number - [id number] Reference # - [id number] Approved for 5 days effective 11/5 Next review date - 11/9 [place].  Fax clinicals for "continued stay review" to [phone]  4:29 pm - Call made to [facility] - admissions liaison with [facility] and authorization information provided. Margarita Grizzle advised that MD contacted and COVID test requested. CSW will continue to follow and facilitate discharge to [facility] once COVID test results.  Shanoah Asbill Givens, MSW, LCSW Licensed Clinical Social Worker [place] [phone]

## 2019-01-25 DIAGNOSIS — R410 Disorientation, unspecified: Secondary | ICD-10-CM

## 2019-01-25 LAB — METHYLMALONIC ACID, SERUM: Methylmalonic Acid, Quantitative: 177 nmol/L (ref 0–378)

## 2019-01-25 MED ORDER — VITAMIN B-1 100 MG PO TABS: 100.0000 mg | ORAL_TABLET | Freq: Every day | ORAL | 0 refills | Status: DC

## 2019-01-25 MED ORDER — FOLIC ACID 1 MG PO TABS: 1.0000 mg | ORAL_TABLET | Freq: Every day | ORAL | 0 refills | Status: AC

## 2019-01-25 MED ORDER — THIAMINE HCL 100 MG PO TABS: 100.0000 mg | ORAL_TABLET | Freq: Three times a day (TID) | ORAL | Status: DC

## 2019-01-25 MED ORDER — SULFAMETHOXAZOLE-TRIMETHOPRIM 800-160 MG PO TABS: 1.0000 | ORAL_TABLET | Freq: Two times a day (BID) | ORAL | 0 refills | Status: DC

## 2019-01-25 MED ORDER — SULFAMETHOXAZOLE-TRIMETHOPRIM 800-160 MG PO TABS: 1.0000 | ORAL_TABLET | Freq: Two times a day (BID) | ORAL | 0 refills | Status: AC

## 2019-01-25 MED ORDER — BACITRACIN ZINC 500 UNIT/GM EX OINT: TOPICAL_OINTMENT | Freq: Two times a day (BID) | CUTANEOUS | 0 refills | Status: AC

## 2019-01-25 MED ORDER — VITAMIN B-1 100 MG PO TABS: 100.0000 mg | ORAL_TABLET | Freq: Every day | ORAL | 0 refills | Status: AC

## 2019-01-25 MED ORDER — COLLAGENASE 250 UNIT/GM EX OINT: TOPICAL_OINTMENT | Freq: Every day | CUTANEOUS | 0 refills | Status: AC

## 2019-01-25 MED ORDER — ADULT MULTIVITAMIN W/MINERALS CH: 1.0000 | ORAL_TABLET | Freq: Every day | ORAL | 0 refills | Status: AC

## 2019-01-25 MED ORDER — THIAMINE HCL 100 MG PO TABS: 100.0000 mg | ORAL_TABLET | Freq: Three times a day (TID) | ORAL | Status: AC

## 2019-01-25 MED ORDER — ERYTHROMYCIN 5 MG/GM OP OINT: TOPICAL_OINTMENT | Freq: Four times a day (QID) | OPHTHALMIC | 0 refills | Status: AC

## 2019-01-25 MED ORDER — HALOPERIDOL LACTATE 5 MG/ML IJ SOLN
1.0000 mg | Freq: Four times a day (QID) | INTRAMUSCULAR | Status: DC | PRN
  Administered 2019-01-25: 1 mg via INTRAMUSCULAR

## 2019-01-25 MED ORDER — ENSURE ENLIVE PO LIQD: 237.0000 mL | Freq: Two times a day (BID) | ORAL | 12 refills | Status: AC

## 2019-01-25 MED ORDER — ERYTHROMYCIN 5 MG/GM OP OINT: TOPICAL_OINTMENT | Freq: Four times a day (QID) | OPHTHALMIC | 0 refills | Status: DC

## 2019-01-25 MED ORDER — NICOTINE 14 MG/24HR TD PT24: 14.0000 mg | MEDICATED_PATCH | Freq: Every day | TRANSDERMAL | 0 refills | Status: AC

## 2019-01-25 MED ORDER — LOSARTAN POTASSIUM 25 MG PO TABS: 25.0000 mg | ORAL_TABLET | Freq: Every day | ORAL | 11 refills | Status: AC

## 2019-01-25 NOTE — TOC Transition Note (Signed)
Transition of Care (TOC) - CM/SW Discharge Note *Discharged to [facility], transported by ambulance *Room 209; number for report - [phone]    Patient Details  Name: Bryce Thornton MRN: [id number] Date of Birth: 1950/09/19  Transition of Care [facility]) CM/SW Contact:  Sable Feil, LCSW Phone Number: 01/25/2019, 10:16 AM   Clinical Narrative: Patient will discharge to [facility] in [place]. Margarita Grizzle, admissions liaison contacted and advised that d/c summary completed and document transmitted to facility. Daughter Rica Mote contacted and updated on discharge.       Final next level of care: Skilled Nursing Facility Barriers to Discharge: No Barriers Identified   Patient Goals and CMS Choice Patient states their goals for this hospitalization and ongoing recovery are:: Patient and daughter want patient to go to rehab to get stronger then return home CMS Medicare.gov Compare Post Acute Care list provided to:: Other (Comment Required)(Daughter given website info for [email or web address]) Choice offered to / list presented to : Adult Children(Daughter provided with information regarding [email or web address])  Discharge Placement - [facility] number confirmed : 01/22/19          Patient chooses bed at: ([facility] in [place]) Patient to be transferred to facility by: Ambulance Name of family member notified: Daughter Ovidio Kin - [phone] Patient and family notified of of transfer: 01/25/19  Discharge Plan and Services In-house Referral: Clinical Social Work                                 Social Determinants of Health (SDOH) Interventions  No SDOH interventions needed prior to discharge.   Readmission Risk Interventions No flowsheet data found.

## 2019-01-25 NOTE — Progress Notes (Signed)
[facility] and gave report to [place], Therapist, sports at the facility.   Farley Ly RN

## 2019-01-25 NOTE — Progress Notes (Signed)
Bryce Thornton to be discharged [facility] per MD order. Patient verbalized understanding.  Skin clean, dry and intact without evidence of skin break down, no evidence of skin tears noted. IV catheter discontinued intact. Site without signs and symptoms of complications. Dressing and pressure applied. Pt denies pain at the site currently. No complaints noted.  Patient free of lines, drains, and wounds.   Discharge packet assembled. An After Visit Summary (AVS) was printed and given to the EMS personnel. Patient escorted via stretcher and discharged to [facility] via ambulance. Report called to accepting facility; all questions and concerns addressed.   Baldo Ash, RN

## 2019-01-25 NOTE — Plan of Care (Signed)
  Problem: Health Behavior/Discharge Planning: Goal: Ability to manage health-related needs will improve Outcome: Progressing   

## 2019-01-25 NOTE — Plan of Care (Signed)
  Problem: Education: Goal: Knowledge of General Education information will improve Description: Including pain rating scale, medication(s)/side effects and non-pharmacologic comfort measures Outcome: Adequate for Discharge   Problem: Health Behavior/Discharge Planning: Goal: Ability to manage health-related needs will improve 01/25/2019 1307 by Baldo Ash, RN Outcome: Adequate for Discharge 01/25/2019 0830 by Baldo Ash, RN Outcome: Progressing   Problem: Clinical Measurements: Goal: Ability to maintain clinical measurements within normal limits will improve Outcome: Adequate for Discharge Goal: Will remain free from infection Outcome: Adequate for Discharge Goal: Diagnostic test results will improve Outcome: Adequate for Discharge Goal: Respiratory complications will improve Outcome: Adequate for Discharge Goal: Cardiovascular complication will be avoided Outcome: Adequate for Discharge   Problem: Activity: Goal: Risk for activity intolerance will decrease Outcome: Adequate for Discharge   Problem: Nutrition: Goal: Adequate nutrition will be maintained Outcome: Adequate for Discharge   Problem: Coping: Goal: Level of anxiety will decrease Outcome: Adequate for Discharge   Problem: Elimination: Goal: Will not experience complications related to bowel motility Outcome: Adequate for Discharge Goal: Will not experience complications related to urinary retention Outcome: Adequate for Discharge   Problem: Pain Managment: Goal: General experience of comfort will improve Outcome: Adequate for Discharge   Problem: Safety: Goal: Ability to remain free from injury will improve Outcome: Adequate for Discharge   Problem: Skin Integrity: Goal: Risk for impaired skin integrity will decrease Outcome: Adequate for Discharge   Problem: Acute Rehab OT Goals (only OT should resolve) Goal: Pt. Will Perform Upper Body Dressing Outcome: Adequate for Discharge Goal: Pt.  Will Perform Lower Body Dressing Outcome: Adequate for Discharge Goal: Pt. Will Transfer To Toilet Outcome: Adequate for Discharge Goal: Pt. Will Perform Toileting-Clothing Manipulation Outcome: Adequate for Discharge   Problem: Acute Rehab PT Goals(only PT should resolve) Goal: Pt Will Go Supine/Side To Sit Outcome: Adequate for Discharge Goal: Patient Will Transfer Sit To/From Stand Outcome: Adequate for Discharge Goal: Pt Will Ambulate Outcome: Adequate for Discharge

## 2019-01-26 LAB — CULTURE, BLOOD (ROUTINE X 2)
Culture: NO GROWTH
Culture: NO GROWTH
Special Requests: ADEQUATE

## 2019-01-27 LAB — VITAMIN B1: Vitamin B1 (Thiamine): 109.3 nmol/L (ref 66.5–200.0)

## 2020-01-30 IMAGING — CR DG KNEE COMPLETE 4+V*L*
4 series · 4 of 4 positions shown · non-contrast
Comparison: None.

CLINICAL DATA: Recent fall with left knee pain, initial encounter

EXAM:
LEFT KNEE - COMPLETE 4+ VIEW

[knee ap]
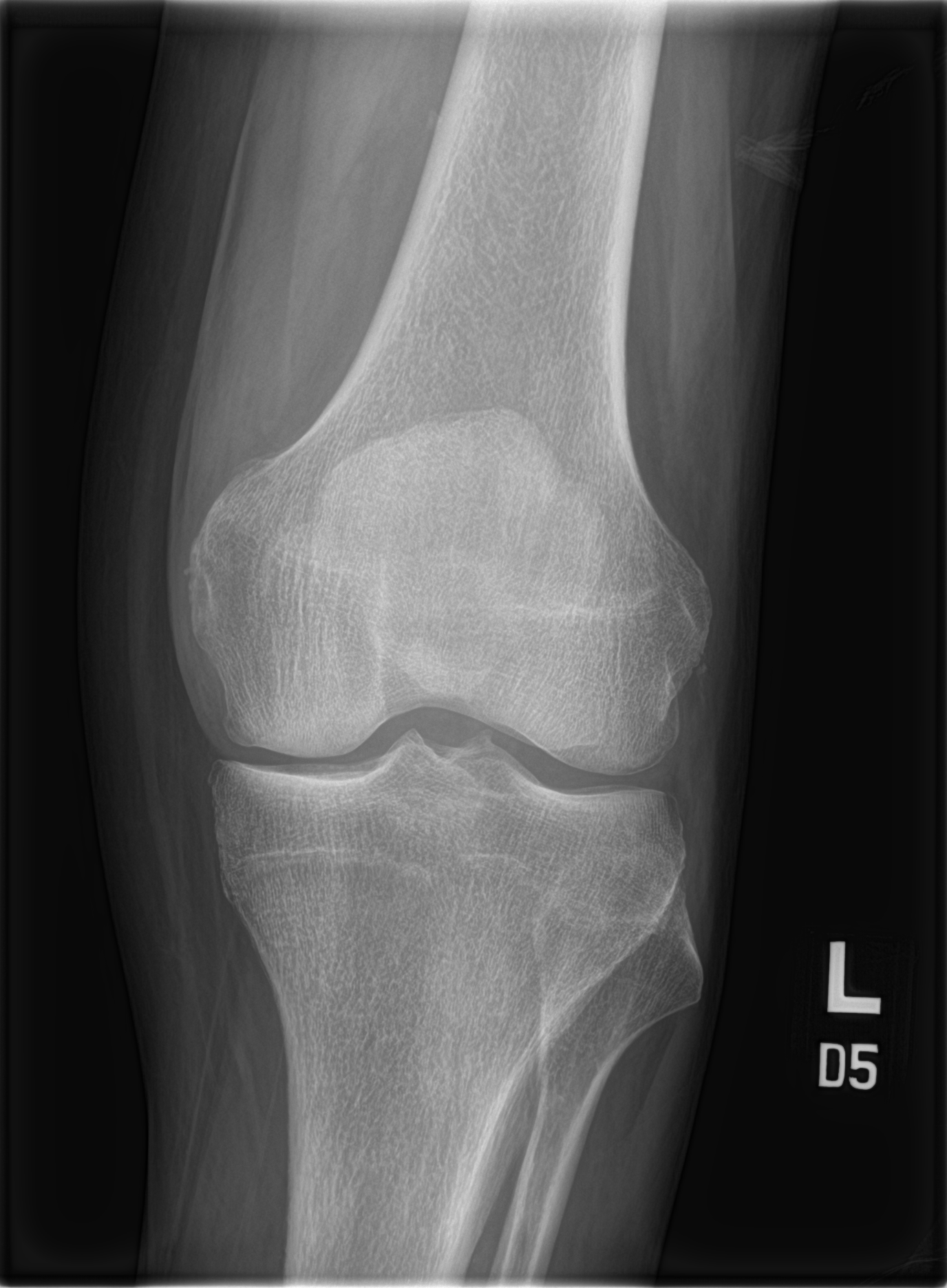

[knee lat]
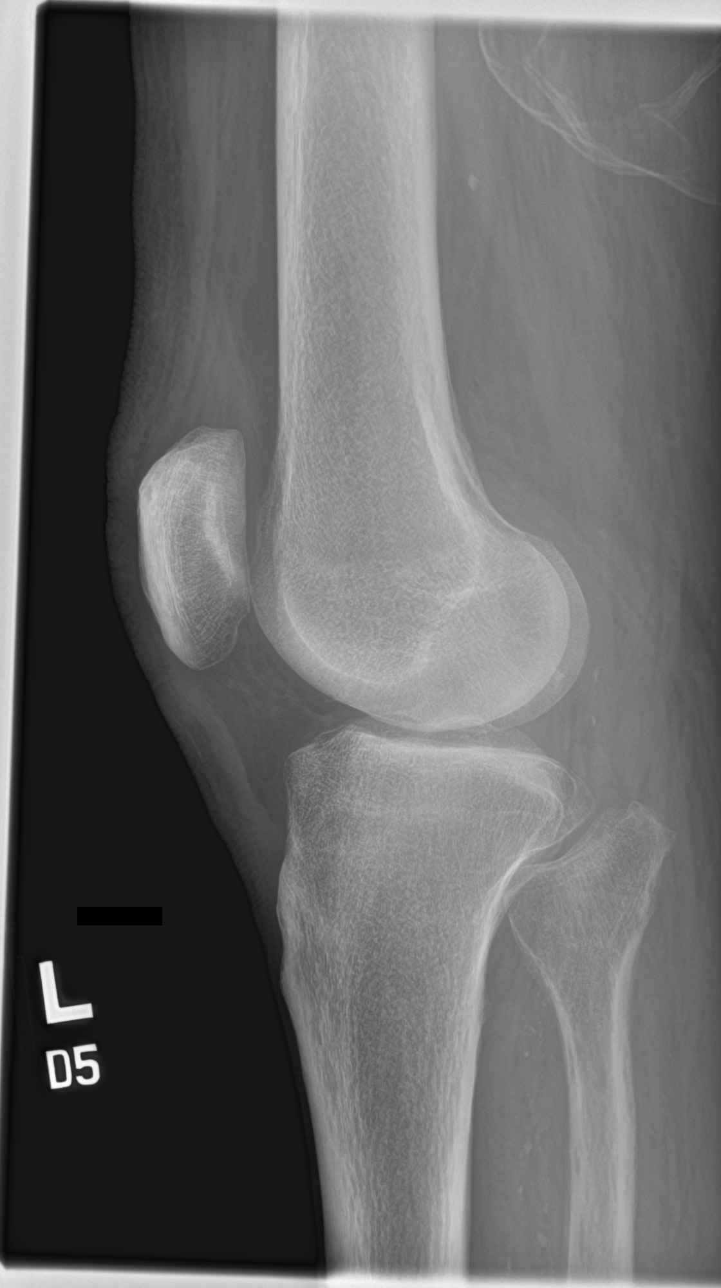

[knee obl (1 of 2)]
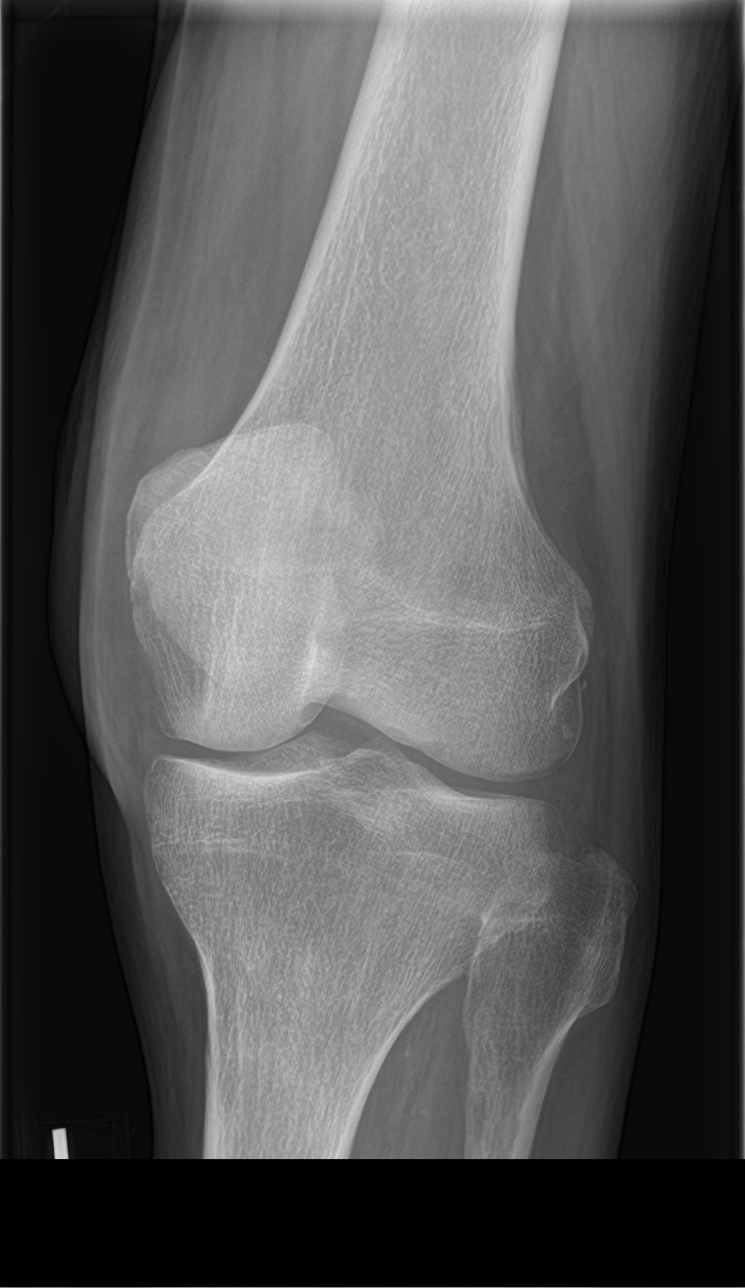

[knee obl (2 of 2)]
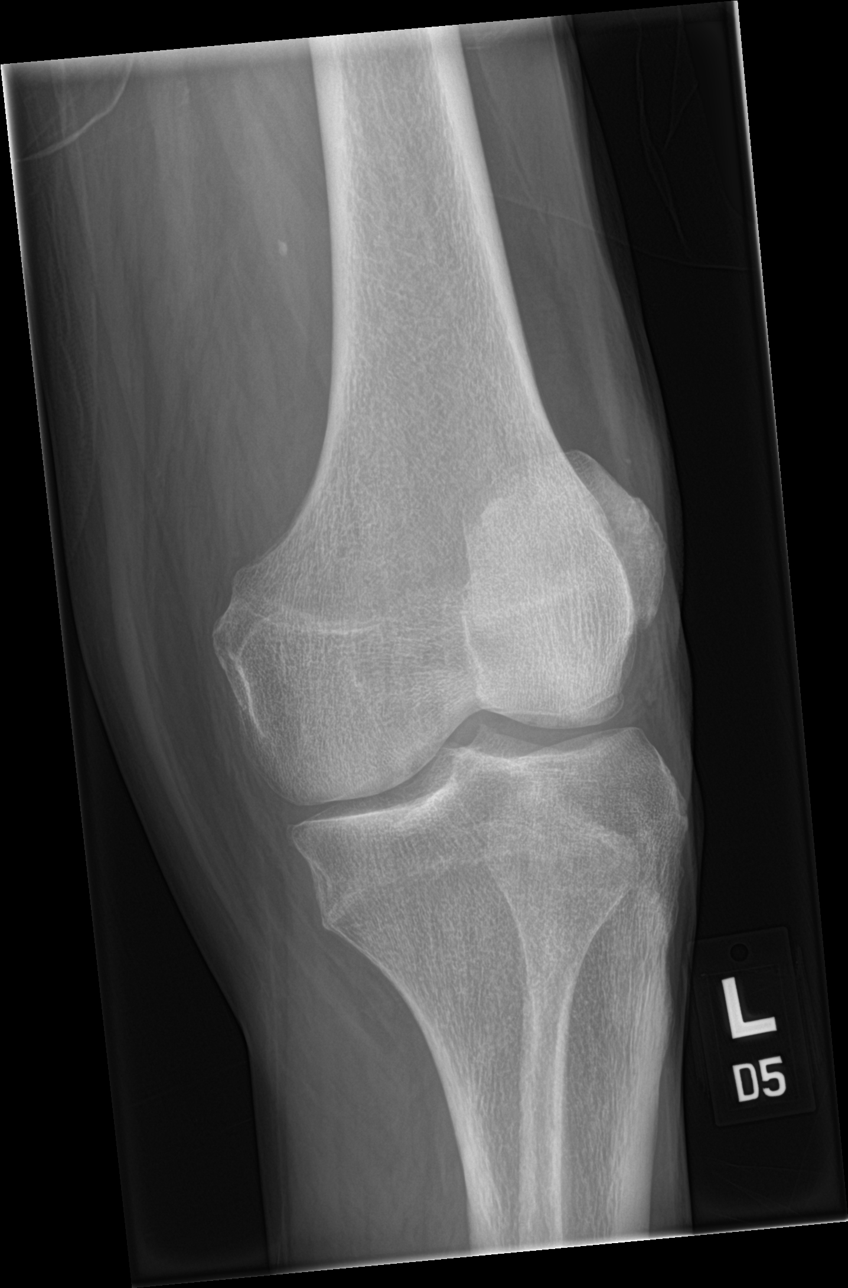

[4 of 4 positions shown; findings below may reference images not displayed]

FINDINGS: No evidence of fracture, dislocation, or joint effusion. No evidence
of arthropathy or other focal bone abnormality. Soft tissues are
unremarkable.
IMPRESSION: No acute abnormality noted.
